# Patient Record
Sex: Female | Born: 1960 | Race: White | Hispanic: No | Smoking: Never smoker
Health system: Southern US, Community
[De-identification: ages and names within clinical notes are randomized; demographics above are authoritative.]

## PROBLEM LIST (undated history)

## (undated) DIAGNOSIS — F419 Anxiety disorder, unspecified: Secondary | ICD-10-CM

## (undated) DIAGNOSIS — M2550 Pain in unspecified joint: Secondary | ICD-10-CM

## (undated) DIAGNOSIS — M1990 Unspecified osteoarthritis, unspecified site: Secondary | ICD-10-CM

## (undated) DIAGNOSIS — E785 Hyperlipidemia, unspecified: Secondary | ICD-10-CM

## (undated) DIAGNOSIS — G589 Mononeuropathy, unspecified: Secondary | ICD-10-CM

## (undated) DIAGNOSIS — I10 Essential (primary) hypertension: Secondary | ICD-10-CM

## (undated) DIAGNOSIS — K219 Gastro-esophageal reflux disease without esophagitis: Secondary | ICD-10-CM

## (undated) DIAGNOSIS — N200 Calculus of kidney: Secondary | ICD-10-CM

## (undated) DIAGNOSIS — N393 Stress incontinence (female) (male): Secondary | ICD-10-CM

## (undated) DIAGNOSIS — K5790 Diverticulosis of intestine, part unspecified, without perforation or abscess without bleeding: Secondary | ICD-10-CM

## (undated) HISTORY — DX: Calculus of kidney: N20.0

## (undated) HISTORY — DX: Stress incontinence (female) (male): N39.3

## (undated) HISTORY — DX: Pain in unspecified joint: M25.50

## (undated) HISTORY — PX: KNEE SURGERY: SHX244

## (undated) HISTORY — PX: SHOULDER ARTHROSCOPY: SHX128

## (undated) HISTORY — DX: Hyperlipidemia, unspecified: E78.5

## (undated) HISTORY — DX: Unspecified osteoarthritis, unspecified site: M19.90

## (undated) HISTORY — PX: ABDOMINAL HYSTERECTOMY: SHX81

## (undated) HISTORY — DX: Mononeuropathy, unspecified: G58.9

## (undated) HISTORY — PX: ECTOPIC PREGNANCY SURGERY: SHX613

## (undated) HISTORY — DX: Anxiety disorder, unspecified: F41.9

## (undated) HISTORY — DX: Essential (primary) hypertension: I10

## (undated) HISTORY — DX: Diverticulosis of intestine, part unspecified, without perforation or abscess without bleeding: K57.90

## (undated) HISTORY — PX: TONSILLECTOMY AND ADENOIDECTOMY: SUR1326

## (undated) HISTORY — PX: CHOLECYSTECTOMY: SHX55

## (undated) HISTORY — PX: OTHER SURGICAL HISTORY: SHX169

## (undated) HISTORY — DX: Gastro-esophageal reflux disease without esophagitis: K21.9

---

## 1997-09-15 ENCOUNTER — Ambulatory Visit (HOSPITAL_COMMUNITY): Admission: RE | Admit: 1997-09-15 | Discharge: 1997-09-15 | Payer: Self-pay | Admitting: Internal Medicine

## 1998-09-20 ENCOUNTER — Other Ambulatory Visit: Admission: RE | Admit: 1998-09-20 | Discharge: 1998-09-20 | Payer: Self-pay | Admitting: Internal Medicine

## 1999-03-30 ENCOUNTER — Ambulatory Visit (HOSPITAL_COMMUNITY): Admission: RE | Admit: 1999-03-30 | Discharge: 1999-03-30 | Payer: Self-pay | Admitting: Internal Medicine

## 1999-03-30 ENCOUNTER — Encounter: Payer: Self-pay | Admitting: Internal Medicine

## 2002-01-21 ENCOUNTER — Encounter: Payer: Self-pay | Admitting: Internal Medicine

## 2002-01-21 ENCOUNTER — Ambulatory Visit (HOSPITAL_COMMUNITY): Admission: RE | Admit: 2002-01-21 | Discharge: 2002-01-21 | Payer: Self-pay | Admitting: Internal Medicine

## 2004-08-22 ENCOUNTER — Ambulatory Visit: Payer: Self-pay | Admitting: Gastroenterology

## 2004-08-24 ENCOUNTER — Ambulatory Visit: Payer: Self-pay | Admitting: Gastroenterology

## 2005-05-03 ENCOUNTER — Encounter: Payer: Self-pay | Admitting: Internal Medicine

## 2006-11-25 ENCOUNTER — Emergency Department: Payer: Self-pay | Admitting: Emergency Medicine

## 2009-05-03 ENCOUNTER — Ambulatory Visit: Admission: RE | Admit: 2009-05-03 | Discharge: 2009-05-03 | Payer: Self-pay | Admitting: General Surgery

## 2009-05-10 ENCOUNTER — Ambulatory Visit (HOSPITAL_COMMUNITY): Admission: RE | Admit: 2009-05-10 | Discharge: 2009-05-10 | Payer: Self-pay | Admitting: General Surgery

## 2009-05-26 ENCOUNTER — Encounter: Admission: RE | Admit: 2009-05-26 | Discharge: 2009-08-24 | Payer: Self-pay | Admitting: General Surgery

## 2009-08-03 ENCOUNTER — Ambulatory Visit (HOSPITAL_COMMUNITY): Admission: RE | Admit: 2009-08-03 | Discharge: 2009-08-04 | Payer: Self-pay | Admitting: General Surgery

## 2009-08-03 HISTORY — PX: LAPAROSCOPIC GASTRIC BANDING: SHX1100

## 2009-08-17 ENCOUNTER — Encounter
Admission: RE | Admit: 2009-08-17 | Discharge: 2009-09-30 | Payer: Self-pay | Source: Home / Self Care | Admitting: General Surgery

## 2009-09-28 ENCOUNTER — Encounter
Admission: RE | Admit: 2009-09-28 | Discharge: 2009-12-27 | Payer: Self-pay | Source: Home / Self Care | Attending: General Surgery | Admitting: General Surgery

## 2009-11-03 LAB — HM COLONOSCOPY

## 2010-01-11 ENCOUNTER — Encounter: Admit: 2010-01-11 | Payer: Self-pay | Admitting: General Surgery

## 2010-03-18 LAB — DIFFERENTIAL
Basophils Relative: 0 % (ref 0–1)
Eosinophils Absolute: 0 10*3/uL (ref 0.0–0.7)
Eosinophils Relative: 0 % (ref 0–5)
Monocytes Absolute: 0.7 10*3/uL (ref 0.1–1.0)
Monocytes Relative: 7 % (ref 3–12)
Neutrophils Relative %: 81 % — ABNORMAL HIGH (ref 43–77)

## 2010-03-18 LAB — CBC
HCT: 38 % (ref 36.0–46.0)
Hemoglobin: 12.9 g/dL (ref 12.0–15.0)
MCH: 32 pg (ref 26.0–34.0)
MCHC: 34 g/dL (ref 30.0–36.0)
MCV: 94.1 fL (ref 78.0–100.0)

## 2010-03-18 LAB — HEMOGLOBIN AND HEMATOCRIT, BLOOD: Hemoglobin: 13.6 g/dL (ref 12.0–15.0)

## 2010-03-19 LAB — DIFFERENTIAL
Eosinophils Relative: 0 % (ref 0–5)
Lymphocytes Relative: 18 % (ref 12–46)
Lymphs Abs: 1.7 10*3/uL (ref 0.7–4.0)
Monocytes Absolute: 0.5 10*3/uL (ref 0.1–1.0)
Neutro Abs: 7.6 10*3/uL (ref 1.7–7.7)

## 2010-03-19 LAB — COMPREHENSIVE METABOLIC PANEL
AST: 26 U/L (ref 0–37)
Albumin: 4 g/dL (ref 3.5–5.2)
Calcium: 9.6 mg/dL (ref 8.4–10.5)
Creatinine, Ser: 0.79 mg/dL (ref 0.4–1.2)
GFR calc Af Amer: >60 mL/min (ref >60)

## 2010-03-19 LAB — CBC
MCH: 31.8 pg (ref 26.0–34.0)
MCHC: 34.5 g/dL (ref 30.0–36.0)
Platelets: 274 10*3/uL (ref 150–400)
RDW: 12.7 % (ref 11.5–15.5)

## 2010-03-19 LAB — SURGICAL PCR SCREEN: Staphylococcus aureus: POSITIVE — AB

## 2010-07-29 ENCOUNTER — Encounter (INDEPENDENT_AMBULATORY_CARE_PROVIDER_SITE_OTHER): Payer: Self-pay

## 2010-07-29 ENCOUNTER — Ambulatory Visit (INDEPENDENT_AMBULATORY_CARE_PROVIDER_SITE_OTHER): Payer: BC Managed Care – PPO | Admitting: Physician Assistant

## 2010-07-29 NOTE — Patient Instructions (Signed)
Obtain upper GI and follow-up after your study.

## 2010-07-29 NOTE — Progress Notes (Signed)
  HISTORY: Tiffany Campos is a 50 y.o.female who received an AP-Standard lap-band in August 2011 by Dr. Daphine Deutscher. She comes in today with rather inconsistent symptoms of regurgitation and unpredictable intolerance of various foods. She denies persistent regurgitation but she does admit to reaching for slider foods when she feels a bit tight. She thinks a fill is in order today because her weight loss has slowed somewhat but she has had no increase in hunger or portion size.  VITAL SIGNS: Filed Vitals:   07/29/10 1549  BP: 106/70    PHYSICAL EXAM: Physical exam reveals a very well-appearing 50 y.o.female in no apparent distress Neurologic: Awake, alert, oriented Psych: Bright affect, conversant Respiratory: Breathing even and unlabored. No stridor or wheezing Extremities: Atraumatic, good range of motion. Skin: Warm, Dry, no rashes Musculoskeletal: Normal gait, Joints normal  ASSESMENT: 50 y.o.  female  s/p AP-Standard lap-band.   PLAN: I'm concerned about the inconsistencies with her food tolerances and as such I've ordered an upper GI. She'll return following her procedure where we'll make a decision on adjusting her band.

## 2010-08-03 ENCOUNTER — Ambulatory Visit
Admission: RE | Admit: 2010-08-03 | Discharge: 2010-08-03 | Disposition: A | Discharge status: Home / Self Care | Payer: BC Managed Care – PPO | Source: Ambulatory Visit | Attending: Physician Assistant | Admitting: Physician Assistant

## 2010-08-05 ENCOUNTER — Encounter (INDEPENDENT_AMBULATORY_CARE_PROVIDER_SITE_OTHER): Payer: Self-pay

## 2010-08-05 ENCOUNTER — Ambulatory Visit (INDEPENDENT_AMBULATORY_CARE_PROVIDER_SITE_OTHER): Payer: BC Managed Care – PPO | Admitting: Physician Assistant

## 2010-08-05 VITALS — BP 110/74 | Ht 65.0 in | Wt 187.0 lb

## 2010-08-05 DIAGNOSIS — Z4651 Encounter for fitting and adjustment of gastric lap band: Secondary | ICD-10-CM

## 2010-08-05 NOTE — Patient Instructions (Signed)
Return in 4 weeks or sooner if necessary

## 2010-08-05 NOTE — Progress Notes (Signed)
  HISTORY: Tiffany Campos is a 50 y.o.female who received an AP-Standard lap-band in August 2011 by Dr. Daphine Deutscher. She was seen last week for intermittent regurgitation and she was scheduled for Upper GI which she attended yesterday. She say she still has regurgitation. 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 She is over-restricted.  VITAL SIGNS: Filed Vitals:   08/05/10 1008  BP: 110/74    PHYSICAL EXAM: Physical exam reveals a very well-appearing 50 y.o.female in no apparent distress Neurologic: Awake, alert, oriented Psych: Bright affect, conversant Respiratory: Breathing even and unlabored. No stridor or wheezing Abdomen: Soft, nontender, nondistended to palpation. Incisions well-healed. No incisional hernias. Port easily palpated. Extremities: Atraumatic, good range of motion.  ASSESMENT: 50 y.o.  female  s/p AP-Standard lap-band. She is suffering from over-restriction.  PLAN: Her band was accessed and 0.5 mL fluid was removed. She was able to swallow water following this. We'll have her back in 4 weeks or sooner if symptoms do not improve.

## 2010-09-02 ENCOUNTER — Encounter (INDEPENDENT_AMBULATORY_CARE_PROVIDER_SITE_OTHER): Payer: BC Managed Care – PPO

## 2010-09-23 ENCOUNTER — Encounter (INDEPENDENT_AMBULATORY_CARE_PROVIDER_SITE_OTHER): Payer: Self-pay

## 2010-09-23 ENCOUNTER — Ambulatory Visit (INDEPENDENT_AMBULATORY_CARE_PROVIDER_SITE_OTHER): Payer: BC Managed Care – PPO | Admitting: Physician Assistant

## 2010-09-23 NOTE — Progress Notes (Signed)
  HISTORY: Tiffany Campos is a 50 y.o.female who received an AP-Standard lap-band in August 2011 by Dr. Daphine Deutscher. She comes in today feeling remarkably better since a modest removal of fluid. She has no further intolerance of solids and while she's continuing to eat small portions, she's able to get in adequate nutrition.  VITAL SIGNS: Filed Vitals:   09/23/10 1540  BP: 100/68  Pulse: 72  Temp: 97.8 F (36.6 C)  Resp: 16    PHYSICAL EXAM: Physical exam reveals a very well-appearing 50 y.o.female in no apparent distress Neurologic: Awake, alert, oriented Psych: Bright affect, conversant Respiratory: Breathing even and unlabored. No stridor or wheezing Extremities: Atraumatic, good range of motion. Skin: Warm, Dry, no rashes Musculoskeletal: Normal gait, Joints normal  ASSESMENT: 50 y.o.  female  s/p AP-Standard lap-band.   PLAN: We'll have her return in 4 weeks for re-evaluation or sooner if she finds a continued increase in weight.

## 2010-09-23 NOTE — Patient Instructions (Signed)
Return in one month or sooner if necessary

## 2010-10-21 ENCOUNTER — Ambulatory Visit (INDEPENDENT_AMBULATORY_CARE_PROVIDER_SITE_OTHER): Payer: BC Managed Care – PPO | Admitting: Physician Assistant

## 2010-10-21 ENCOUNTER — Encounter (INDEPENDENT_AMBULATORY_CARE_PROVIDER_SITE_OTHER): Payer: Self-pay | Admitting: Physician Assistant

## 2010-10-21 VITALS — BP 112/74 | Ht 65.0 in | Wt 191.2 lb

## 2010-10-21 DIAGNOSIS — Z9884 Bariatric surgery status: Secondary | ICD-10-CM

## 2010-10-21 NOTE — Progress Notes (Signed)
Tiffany Campos came in for an adjustment but felt the presence of an apple that she ate about 2 hours ago. I opted to wait a week to adjust her as I'm concerned that she'd become obstructed.

## 2010-10-28 ENCOUNTER — Encounter (INDEPENDENT_AMBULATORY_CARE_PROVIDER_SITE_OTHER): Payer: BC Managed Care – PPO

## 2010-11-04 ENCOUNTER — Encounter (INDEPENDENT_AMBULATORY_CARE_PROVIDER_SITE_OTHER): Payer: Self-pay

## 2010-11-04 ENCOUNTER — Ambulatory Visit (INDEPENDENT_AMBULATORY_CARE_PROVIDER_SITE_OTHER): Payer: BC Managed Care – PPO | Admitting: Physician Assistant

## 2010-11-04 VITALS — BP 124/78 | HR 60 | Temp 97.6°F | Resp 60 | Ht 66.5 in | Wt 193.4 lb

## 2010-11-04 DIAGNOSIS — Z4651 Encounter for fitting and adjustment of gastric lap band: Secondary | ICD-10-CM

## 2010-11-04 DIAGNOSIS — E669 Obesity, unspecified: Secondary | ICD-10-CM

## 2010-11-04 NOTE — Patient Instructions (Signed)
Take clear liquids for the next 48 hours. Thin protein shakes are ok to start on Saturday evening. Call us if you have persistent vomiting or regurgitation, night cough or reflux symptoms. Return as scheduled or sooner if you notice no changes in hunger/portion sizes.   

## 2010-11-04 NOTE — Progress Notes (Signed)
  HISTORY: Tiffany Campos is a 50 y.o.female who received an AP-Standard lap-band in August 2011 by Dr. Daphine Deutscher. She says her hunger and portion sizes have increased but has had no untoward symptoms. She relates some occasional nocturnal abdominal discomfort which self-resolves by morning. No fever. No nausea/vomiting. She also notices that her anxiety symptoms and use of PRN QHS Xanax has increased.  VITAL SIGNS: Filed Vitals:   11/04/10 1110  BP: 124/78  Pulse: 60  Temp: 97.6 F (36.4 C)  Resp: 60    PHYSICAL EXAM: Physical exam reveals a very well-appearing 50 y.o.female in no apparent distress Neurologic: Awake, alert, oriented Psych: Bright affect, conversant Respiratory: Breathing even and unlabored. No stridor or wheezing Abdomen: Soft, nontender, nondistended to palpation. Incisions well-healed. No incisional hernias. Port easily palpated. Extremities: Atraumatic, good range of motion.  ASSESMENT: 50 y.o.  female  s/p AP-Standard lap-band.   PLAN: The patient's port was accessed with a 20G Huber needle without difficulty. Clear fluid was aspirated and 0.2 mL saline was added to the port. The patient was able to swallow water without difficulty following the procedure and was instructed to take clear liquids for the next 24-48 hours and advance slowly as tolerated.

## 2010-11-18 ENCOUNTER — Encounter (INDEPENDENT_AMBULATORY_CARE_PROVIDER_SITE_OTHER): Payer: BC Managed Care – PPO

## 2011-02-02 ENCOUNTER — Encounter (INDEPENDENT_AMBULATORY_CARE_PROVIDER_SITE_OTHER): Payer: Self-pay | Admitting: General Surgery

## 2011-03-22 ENCOUNTER — Encounter (INDEPENDENT_AMBULATORY_CARE_PROVIDER_SITE_OTHER): Payer: Self-pay | Admitting: Surgery

## 2011-03-23 ENCOUNTER — Encounter (INDEPENDENT_AMBULATORY_CARE_PROVIDER_SITE_OTHER): Payer: Self-pay

## 2011-03-23 ENCOUNTER — Ambulatory Visit (INDEPENDENT_AMBULATORY_CARE_PROVIDER_SITE_OTHER): Payer: BC Managed Care – PPO | Admitting: Physician Assistant

## 2011-03-23 NOTE — Patient Instructions (Signed)
Return in 3 months or sooner if needed, especially if you have persistent increases in portion sizes or persistent hunger. Return also if you have persistent over-restriction.

## 2011-03-23 NOTE — Progress Notes (Signed)
  HISTORY: Tiffany Campos is a 51 y.o.female who received an AP-Standard lap-band in August 2011 by Dr. Daphine Deutscher. She comes in today with four pounds of weight loss since her last visit. She continues to have days where she can eat little solid food followed by days that she has no troubles whatsoever. She does not engage in an exercise program, however.  VITAL SIGNS: Filed Vitals:   03/23/11 0938  BP: 124/80  Pulse: 68  Temp: 98.1 F (36.7 C)  Resp: 18    PHYSICAL EXAM: Physical exam reveals a very well-appearing 51 y.o.female in no apparent distress Neurologic: Awake, alert, oriented Psych: Bright affect, conversant Respiratory: Breathing even and unlabored. No stridor or wheezing Extremities: Atraumatic, good range of motion. Skin: Warm, Dry, no rashes Musculoskeletal: Normal gait, Joints normal  ASSESMENT: 51 y.o.  female  s/p AP-Standard lap-band.   PLAN: We talked about increasing her physical activity as this is an obvious missing element in her weight loss program. I recommended walking 30 minutes three times a week then increasing the frequency steadily. She hinted toward wanting an adjustment today but I fear this would turn her intermittent solid food intolerance into a persistent one. She's doing great with her weight loss overall and I don't want that to change.

## 2011-05-25 ENCOUNTER — Encounter (INDEPENDENT_AMBULATORY_CARE_PROVIDER_SITE_OTHER): Payer: BC Managed Care – PPO

## 2011-05-25 HISTORY — PX: ROTATOR CUFF REPAIR: SHX139

## 2011-06-01 ENCOUNTER — Encounter (INDEPENDENT_AMBULATORY_CARE_PROVIDER_SITE_OTHER): Payer: Self-pay

## 2011-06-01 ENCOUNTER — Ambulatory Visit (INDEPENDENT_AMBULATORY_CARE_PROVIDER_SITE_OTHER): Payer: BC Managed Care – PPO | Admitting: Physician Assistant

## 2011-06-01 VITALS — BP 120/82 | HR 66 | Temp 99.2°F | Resp 18 | Ht 65.0 in | Wt 189.0 lb

## 2011-06-01 DIAGNOSIS — Z4651 Encounter for fitting and adjustment of gastric lap band: Secondary | ICD-10-CM

## 2011-06-01 NOTE — Progress Notes (Signed)
  HISTORY: Tiffany Campos is a 51 y.o.female who received an AP-Standard lap-band in August 2011 by Dr. Daphine Deutscher. She comes in having an increase in her hunger and portion sizes. She said she's gained about six pounds since her last visit but managed to lose it again since having R shoulder arthroscopy. She denies persistent regurgitation or reflux.  VITAL SIGNS: Filed Vitals:   06/01/11 1057  BP: 120/82  Pulse: 66  Temp: 99.2 F (37.3 C)  Resp: 18    PHYSICAL EXAM: Physical exam reveals a very well-appearing 51 y.o.female in no apparent distress Neurologic: Awake, alert, oriented Psych: Bright affect, conversant Respiratory: Breathing even and unlabored. No stridor or wheezing Abdomen: Soft, nontender, nondistended to palpation. Incisions well-healed. No incisional hernias. Port easily palpated. Extremities: Atraumatic, good range of motion.  ASSESMENT: 51 y.o.  female  s/p AP-Standard lap-band.   PLAN: The patient's port was accessed with a 20G Huber needle without difficulty. Clear fluid was aspirated and 0.25 mL saline was added to the port. The patient was able to swallow water without difficulty following the procedure and was instructed to take clear liquids for the next 24-48 hours and advance slowly as tolerated.

## 2011-06-01 NOTE — Patient Instructions (Signed)
Take clear liquids tonight. Thin protein shakes are ok to start tomorrow morning. Slowly advance your diet thereafter. Call us if you have persistent vomiting or regurgitation, night cough or reflux symptoms. Return as scheduled or sooner if you notice no changes in hunger/portion sizes.  

## 2011-06-22 ENCOUNTER — Ambulatory Visit (INDEPENDENT_AMBULATORY_CARE_PROVIDER_SITE_OTHER): Payer: BC Managed Care – PPO | Admitting: Physician Assistant

## 2011-06-22 ENCOUNTER — Encounter (INDEPENDENT_AMBULATORY_CARE_PROVIDER_SITE_OTHER): Payer: Self-pay

## 2011-06-22 VITALS — BP 124/80 | Ht 65.0 in | Wt 187.0 lb

## 2011-06-22 DIAGNOSIS — Z4651 Encounter for fitting and adjustment of gastric lap band: Secondary | ICD-10-CM

## 2011-06-22 NOTE — Progress Notes (Signed)
  HISTORY: Tiffany Campos is a 51 y.o.female who received an AP-Standard lap-band in August 2011 by Dr. Daphine Deutscher. She comes in today with complaints of solid food dysphagia since her last visit. She is able to get down crunchy foods and foods high in carbohydrate but has great difficulty with proteins.  VITAL SIGNS: Filed Vitals:   06/22/11 0838  BP: 124/80    PHYSICAL EXAM: Physical exam reveals a very well-appearing 51 y.o.female in no apparent distress Neurologic: Awake, alert, oriented Psych: Bright affect, conversant Respiratory: Breathing even and unlabored. No stridor or wheezing Abdomen: Soft, nontender, nondistended to palpation. Incisions well-healed. No incisional hernias. Port easily palpated. Extremities: Atraumatic, good range of motion.  ASSESMENT: 51 y.o.  female  s/p AP-Standard lap-band.   PLAN: The patient's port was accessed with a 20G Huber needle without difficulty. Clear fluid was aspirated and 0.25 mL saline was removed from the port. I recommended that she slowly advance her diet and to return to see [place] if she continues to have difficulty getting food down.

## 2011-06-22 NOTE — Patient Instructions (Signed)
Slowly advance diet. If you continue to have difficulty swallowing, call for a return visit.

## 2011-08-31 ENCOUNTER — Encounter (INDEPENDENT_AMBULATORY_CARE_PROVIDER_SITE_OTHER): Payer: BC Managed Care – PPO

## 2012-05-09 ENCOUNTER — Encounter (INDEPENDENT_AMBULATORY_CARE_PROVIDER_SITE_OTHER): Payer: BC Managed Care – PPO

## 2012-05-16 ENCOUNTER — Ambulatory Visit (INDEPENDENT_AMBULATORY_CARE_PROVIDER_SITE_OTHER): Payer: BC Managed Care – PPO | Admitting: Physician Assistant

## 2012-05-16 ENCOUNTER — Encounter (INDEPENDENT_AMBULATORY_CARE_PROVIDER_SITE_OTHER): Payer: Self-pay

## 2012-05-16 VITALS — BP 122/74 | HR 68 | Temp 98.0°F | Resp 18 | Ht 65.0 in | Wt 202.4 lb

## 2012-05-16 DIAGNOSIS — Z4651 Encounter for fitting and adjustment of gastric lap band: Secondary | ICD-10-CM

## 2012-05-16 NOTE — Patient Instructions (Signed)
Take clear liquids tonight. Thin protein shakes are ok to start tomorrow morning. Slowly advance your diet thereafter. Call us if you have persistent vomiting or regurgitation, night cough or reflux symptoms. Return as scheduled or sooner if you notice no changes in hunger/portion sizes.  

## 2012-05-16 NOTE — Progress Notes (Signed)
  HISTORY: Tiffany Campos is a 52 y.o.female who received an AP-Standard lap-band in August 2011 by Dr. Daphine Deutscher. She comes in with about 15 lbs weight gain since her last visit in June 2013. She has increased hunger and portion sizes but fortunately she has regurgitation only rarely. She would like a fill today to get the weight loss under better control.  VITAL SIGNS: Filed Vitals:   05/16/12 1041  BP: 122/74  Pulse: 68  Temp: 98 F (36.7 C)  Resp: 18    PHYSICAL EXAM: Physical exam reveals a very well-appearing 52 y.o.female in no apparent distress Neurologic: Awake, alert, oriented Psych: Bright affect, conversant Respiratory: Breathing even and unlabored. No stridor or wheezing Abdomen: Soft, nontender, nondistended to palpation. Incisions well-healed. No incisional hernias. Port easily palpated. Extremities: Atraumatic, good range of motion.  ASSESMENT: 52 y.o.  female  s/p AP-Standard lap-band.   PLAN: The patient's port was accessed with a 20G Huber needle without difficulty. Clear fluid was aspirated and 0.25 mL saline was added to the port. The patient was able to swallow water without difficulty following the procedure and was instructed to take clear liquids for the next 24-48 hours and advance slowly as tolerated.

## 2012-08-08 ENCOUNTER — Ambulatory Visit (INDEPENDENT_AMBULATORY_CARE_PROVIDER_SITE_OTHER): Payer: BC Managed Care – PPO | Admitting: Physician Assistant

## 2012-08-08 ENCOUNTER — Encounter (INDEPENDENT_AMBULATORY_CARE_PROVIDER_SITE_OTHER): Payer: Self-pay

## 2012-08-08 VITALS — BP 128/78 | HR 64 | Temp 98.4°F | Resp 16 | Ht 65.0 in | Wt 197.8 lb

## 2012-08-08 DIAGNOSIS — Z4651 Encounter for fitting and adjustment of gastric lap band: Secondary | ICD-10-CM

## 2012-08-08 NOTE — Progress Notes (Signed)
  HISTORY: Tiffany Campos is a 52 y.o.female who received an AP-Standard lap-band in August 2011 by Dr. Daphine Deutscher. She comes in with three months of obstructive symptoms. She began having these symptoms right after her last fill. She wanted to wait things out for the remaining months but she's having obstructive days for at least 5/7 days. She also complains of reflux for which she began taking nexium again with some relief.  VITAL SIGNS: Filed Vitals:   08/08/12 1039  BP: 128/78  Pulse: 64  Temp: 98.4 F (36.9 C)  Resp: 16    PHYSICAL EXAM: Physical exam reveals a very well-appearing 52 y.o.female in no apparent distress Neurologic: Awake, alert, oriented Psych: Bright affect, conversant Respiratory: Breathing even and unlabored. No stridor or wheezing Abdomen: Soft, nontender, nondistended to palpation. Incisions well-healed. No incisional hernias. Port easily palpated. Extremities: Atraumatic, good range of motion.  ASSESMENT: 52 y.o.  female  s/p AP-Standard lap-band.   PLAN: The patient's port was accessed with a 20G Huber needle without difficulty. Clear fluid was aspirated and 1 mL saline was removed from the port. The patient was advised to concentrate on healthy food choices and to avoid slider foods high in fats and carbohydrates. We'll have her back in one month to get back on track.

## 2012-08-08 NOTE — Patient Instructions (Addendum)
Return in September. Focus on good food choices as well as physical activity. Return sooner if you have an increase in hunger, portion sizes or weight. Return also for difficulty swallowing, night cough, reflux.

## 2012-08-15 ENCOUNTER — Encounter (INDEPENDENT_AMBULATORY_CARE_PROVIDER_SITE_OTHER): Payer: BC Managed Care – PPO

## 2012-09-19 ENCOUNTER — Ambulatory Visit (INDEPENDENT_AMBULATORY_CARE_PROVIDER_SITE_OTHER): Payer: BC Managed Care – PPO | Admitting: Physician Assistant

## 2012-09-19 ENCOUNTER — Encounter (INDEPENDENT_AMBULATORY_CARE_PROVIDER_SITE_OTHER): Payer: Self-pay

## 2012-09-19 VITALS — BP 130/90 | HR 72 | Resp 14 | Ht 65.5 in | Wt 200.2 lb

## 2012-09-19 DIAGNOSIS — Z4651 Encounter for fitting and adjustment of gastric lap band: Secondary | ICD-10-CM

## 2012-09-19 NOTE — Progress Notes (Signed)
  HISTORY: Ryley Teater is a 52 y.o.female who received an AP-Standard lap-band in August 2011 by Dr. Daphine Deutscher. She comes in today with about 3 pounds of weight gain since her last visit in August. She states that she had kept the weight off until about 3 days ago. She says surprisingly, despite removal of 1 mL, she has been able to keep her hunger and portion sizes under control without great effort. She does think a little bit of fluid would help her. She denies regurgitation or vomiting. She still has occasional reflux for which she takes Nexium.Marland Kitchen  VITAL SIGNS: Filed Vitals:   09/19/12 0947  BP: 130/90  Pulse: 72  Resp: 14    PHYSICAL EXAM: Physical exam reveals a very well-appearing 52 y.o.female in no apparent distress Neurologic: Awake, alert, oriented Psych: Bright affect, conversant Respiratory: Breathing even and unlabored. No stridor or wheezing Abdomen: Soft, nontender, nondistended to palpation. Incisions well-healed. No incisional hernias. Port easily palpated. Extremities: Atraumatic, good range of motion.  ASSESMENT: 52 y.o.  female  s/p AP-Standard lap-band.   PLAN: The patient's port was accessed with a 20G Huber needle without difficulty. Clear fluid was aspirated and 0.25 mL saline was added to the port. The patient was able to swallow water without difficulty following the procedure and was instructed to take clear liquids for the next 24-48 hours and advance slowly as tolerated.

## 2012-09-19 NOTE — Patient Instructions (Signed)

## 2012-10-31 ENCOUNTER — Encounter (INDEPENDENT_AMBULATORY_CARE_PROVIDER_SITE_OTHER): Payer: Self-pay

## 2012-10-31 ENCOUNTER — Ambulatory Visit (INDEPENDENT_AMBULATORY_CARE_PROVIDER_SITE_OTHER): Payer: BC Managed Care – PPO | Admitting: Physician Assistant

## 2012-10-31 VITALS — BP 118/80 | HR 68 | Resp 16 | Ht 65.0 in | Wt 204.8 lb

## 2012-10-31 DIAGNOSIS — Z4651 Encounter for fitting and adjustment of gastric lap band: Secondary | ICD-10-CM

## 2012-10-31 NOTE — Progress Notes (Signed)
  HISTORY: Tiffany Campos is a 52 y.o.female who received an AP-Standard lap-band in August 2011 by Dr. Daphine Deutscher. She comes in with 4.5 lbs weight gain in the past month. She attributes this to eating too much and eating foods that are easier to tolerate. She describes persistent hunger and larger portions. She would like a fill today.  VITAL SIGNS: Filed Vitals:   10/31/12 1109  BP: 118/80  Pulse: 68  Resp: 16    PHYSICAL EXAM: Physical exam reveals a very well-appearing 52 y.o.female in no apparent distress Neurologic: Awake, alert, oriented Psych: Bright affect, conversant Respiratory: Breathing even and unlabored. No stridor or wheezing Abdomen: Soft, nontender, nondistended to palpation. Incisions well-healed. No incisional hernias. Port easily palpated. Extremities: Atraumatic, good range of motion.  ASSESMENT: 52 y.o.  female  s/p AP-Standard lap-band.   PLAN: The patient's port was accessed with a 20G Huber needle without difficulty. Clear fluid was aspirated and 0.5 mL saline was added to the port. Initially she took some rather large gulps of water and had some burping but with smaller volumes she did well with no evidence of obstruction. She was instructed to take clear liquids for the next 24-48 hours and advance slowly as tolerated.

## 2012-10-31 NOTE — Patient Instructions (Signed)

## 2012-12-19 ENCOUNTER — Encounter (INDEPENDENT_AMBULATORY_CARE_PROVIDER_SITE_OTHER): Payer: BC Managed Care – PPO

## 2013-03-06 ENCOUNTER — Ambulatory Visit (INDEPENDENT_AMBULATORY_CARE_PROVIDER_SITE_OTHER): Payer: BC Managed Care – PPO | Admitting: Physician Assistant

## 2013-03-06 ENCOUNTER — Encounter (INDEPENDENT_AMBULATORY_CARE_PROVIDER_SITE_OTHER): Payer: Self-pay | Admitting: Physician Assistant

## 2013-03-06 VITALS — BP 118/82 | HR 76 | Temp 97.6°F | Resp 18 | Ht 65.0 in | Wt 206.5 lb

## 2013-03-06 DIAGNOSIS — Z9884 Bariatric surgery status: Secondary | ICD-10-CM | POA: Insufficient documentation

## 2013-03-06 DIAGNOSIS — R1310 Dysphagia, unspecified: Secondary | ICD-10-CM

## 2013-03-06 NOTE — Progress Notes (Signed)
  HISTORY: Tiffany Campos is a 53 y.o.female who received an AP-Standard lap-band in August 2011 by Dr. Daphine DeutscherMartin. She comes in with 2 lbs weight gain since her last visit in October. She describes repeated episodes of solid food dysphagia after 2-3 bites of food, which will persist for a week or more then seemingly resolves. She does not report any consistent troublesome foods when this occurs. She describes rather strong retching over time as well. She wants a fill because of her weight gain and the feeling sometimes that she can eat more than she should.  VITAL SIGNS: Filed Vitals:   03/06/13 1117  BP: 118/82  Pulse: 76  Temp: 97.6 F (36.4 C)  Resp: 18    PHYSICAL EXAM: Physical exam reveals a very well-appearing 53 y.o.female in no apparent distress Neurologic: Awake, alert, oriented Psych: Bright affect, conversant Respiratory: Breathing even and unlabored. No stridor or wheezing Extremities: Atraumatic, good range of motion. Skin: Warm, Dry, no rashes Musculoskeletal: Normal gait, Joints normal  ASSESMENT: 53 y.o.  female  s/p AP-Standard lap-band.   PLAN: I'm concerned about slip/prolapse. It's very difficult to get a consistent picture of what's happening. She is avoiding dry sources of protein and is using sauces to get foods down. I fear that she may actually be too tight. We'll have her back after the study.

## 2013-03-06 NOTE — Patient Instructions (Signed)
Obtain your upper GI study. Return to clinic afterward.

## 2013-03-12 ENCOUNTER — Ambulatory Visit
Admission: RE | Admit: 2013-03-12 | Discharge: 2013-03-12 | Disposition: A | Discharge status: Home / Self Care | Payer: BC Managed Care – PPO | Source: Ambulatory Visit | Attending: Physician Assistant | Admitting: Physician Assistant

## 2013-03-12 DIAGNOSIS — R1310 Dysphagia, unspecified: Secondary | ICD-10-CM

## 2013-03-12 DIAGNOSIS — Z9884 Bariatric surgery status: Secondary | ICD-10-CM

## 2013-03-13 ENCOUNTER — Encounter (INDEPENDENT_AMBULATORY_CARE_PROVIDER_SITE_OTHER): Payer: Self-pay | Admitting: Surgery

## 2013-03-13 ENCOUNTER — Encounter (INDEPENDENT_AMBULATORY_CARE_PROVIDER_SITE_OTHER): Payer: BC Managed Care – PPO

## 2013-03-13 ENCOUNTER — Ambulatory Visit (INDEPENDENT_AMBULATORY_CARE_PROVIDER_SITE_OTHER): Payer: BC Managed Care – PPO | Admitting: Surgery

## 2013-03-13 VITALS — BP 112/84 | HR 68 | Temp 98.3°F | Resp 16 | Ht 65.0 in | Wt 204.8 lb

## 2013-03-13 DIAGNOSIS — Z9884 Bariatric surgery status: Secondary | ICD-10-CM | POA: Insufficient documentation

## 2013-03-13 NOTE — Patient Instructions (Signed)
Work on portion control and what you eat.  Good Carbs:  Brocccoli, asparagus, spinach, mustard greens,    Intermediate:  Beans  Bad Carbs: Potatoes (white, sweet, or [place] Fries), carrots, squash,

## 2013-03-13 NOTE — Progress Notes (Signed)
Lapband Fill Encounter Problem List:   Patient Active Problem List   Diagnosis Date Noted  . [facility] 2011 03/13/2013    Tiffany Campos Body mass index is 34.08 kg/(m^2). Weight loss since surgery  37  Having regurgitation?:  yes  Feel that they need a fill?    Nocturnal reflux?  Not necessarily  Amount of fill  -5     Instructions given and weight loss goals discussed.    I think it Tiffany Campos has been overfilled and has developed maladaptive eating. I reviewed her upper GI and it may be that we will need to ultimately endoscope her period at the present time in her band medication is in order and we will then build from that.  Return 2 weeks  Matt B. Daphine DeutscherMartin, MD, FACS

## 2013-03-27 ENCOUNTER — Encounter (INDEPENDENT_AMBULATORY_CARE_PROVIDER_SITE_OTHER): Payer: Self-pay | Admitting: Surgery

## 2013-03-27 ENCOUNTER — Ambulatory Visit (INDEPENDENT_AMBULATORY_CARE_PROVIDER_SITE_OTHER): Payer: BC Managed Care – PPO | Admitting: Surgery

## 2013-03-27 VITALS — BP 122/80 | HR 64 | Resp 14 | Ht 65.5 in | Wt 212.0 lb

## 2013-03-27 DIAGNOSIS — Z9884 Bariatric surgery status: Secondary | ICD-10-CM

## 2013-03-27 NOTE — Progress Notes (Signed)
Lapband Fill Encounter Problem List:   Patient Active Problem List   Diagnosis Date Noted  . [facility] 03/[facility]/2013    Tiffany Campos Body mass index is 34.73 kg/(m^2). Weight loss since surgery  29.6  Having regurgitation?:  no  Feel that they need a fill?  yes  Nocturnal reflux?  no  Amount of fill  2.5     Instructions given and weight loss goals discussed.    5 cc had been removed before and patient had a band vacation.  Now time to try to get back to the green zone.  3 added initially and this pushed back to 2.5 cc.   Return 2 weeks.   Matt B. Daphine DeutscherMartin, MD, FACS

## 2013-03-27 NOTE — Patient Instructions (Signed)
Good Carbs:  Brocccoli, asparagus, spinach, mustard greens,    Intermediate:  Beans  Bad Carbs: Potatoes (white, sweet, or [place] Fries), carrots, squash, corn or corn products (chips), rice, pasta

## 2013-04-03 ENCOUNTER — Ambulatory Visit (INDEPENDENT_AMBULATORY_CARE_PROVIDER_SITE_OTHER): Payer: BC Managed Care – PPO | Admitting: Surgery

## 2013-04-03 ENCOUNTER — Encounter (INDEPENDENT_AMBULATORY_CARE_PROVIDER_SITE_OTHER): Payer: Self-pay | Admitting: Surgery

## 2013-04-03 VITALS — BP 122/86 | HR 80 | Temp 97.8°F | Resp 16 | Ht 65.5 in | Wt 214.0 lb

## 2013-04-03 DIAGNOSIS — Z9884 Bariatric surgery status: Secondary | ICD-10-CM

## 2013-04-03 NOTE — Progress Notes (Signed)
Lapband Fill Encounter Problem List:   Patient Active Problem List   Diagnosis Date Noted  . [facility] 03/13/2013    Jenny Feigel Body mass index is 35.06 kg/(m^2). Weight loss since surgery  27.6  Having regurgitation?:  no  Feel that they need a fill?  yes  Nocturnal reflux?  no  Amount of fill  1.5     Instructions given and weight loss goals discussed.    She has been feeling totally unrestricted.  Added 1.5 cc to band.  Will see her back in 3 weeks-continue to monitor for prolapse or recurrent hiatus hernia  Matt B. Daphine DeutscherMartin, MD, FACS

## 2013-04-23 ENCOUNTER — Ambulatory Visit (INDEPENDENT_AMBULATORY_CARE_PROVIDER_SITE_OTHER): Payer: BC Managed Care – PPO | Admitting: Surgery

## 2013-04-23 ENCOUNTER — Encounter (INDEPENDENT_AMBULATORY_CARE_PROVIDER_SITE_OTHER): Payer: Self-pay | Admitting: Surgery

## 2013-04-23 VITALS — BP 126/78 | HR 77 | Temp 97.4°F | Ht 65.0 in | Wt 213.4 lb

## 2013-04-23 DIAGNOSIS — Z4651 Encounter for fitting and adjustment of gastric lap band: Secondary | ICD-10-CM

## 2013-04-23 NOTE — Progress Notes (Signed)
Lapband Fill Encounter Problem List:   Patient Active Problem List   Diagnosis Date Noted  . [facility] 03/[facility]2013    Rikayla Tamburri Body mass index is 35.51 kg/(m^2). Weight loss since surgery  28.2  Having regurgitation?:  no  Feel that they need a fill?  yes  Nocturnal reflux?  no  Amount of fill  0.5     Instructions given and weight loss goals discussed.    Discussed what she is doing right and slips that she has--drinking wine in the evening.    Matt B. Daphine DeutscherMartin, MD, FACS

## 2013-04-23 NOTE — Patient Instructions (Signed)

## 2013-05-23 ENCOUNTER — Encounter (INDEPENDENT_AMBULATORY_CARE_PROVIDER_SITE_OTHER): Payer: BC Managed Care – PPO | Admitting: Surgery

## 2013-08-06 LAB — HM MAMMOGRAPHY: HM Mammogram: NORMAL

## 2013-11-04 ENCOUNTER — Ambulatory Visit (INDEPENDENT_AMBULATORY_CARE_PROVIDER_SITE_OTHER): Payer: BC Managed Care – PPO | Admitting: Family Medicine

## 2013-11-04 ENCOUNTER — Encounter: Payer: Self-pay | Admitting: Family Medicine

## 2013-11-04 VITALS — BP 110/78 | HR 69 | Temp 97.8°F | Ht 64.5 in | Wt 201.0 lb

## 2013-11-04 DIAGNOSIS — I10 Essential (primary) hypertension: Secondary | ICD-10-CM

## 2013-11-04 DIAGNOSIS — K219 Gastro-esophageal reflux disease without esophagitis: Secondary | ICD-10-CM

## 2013-11-04 DIAGNOSIS — F411 Generalized anxiety disorder: Secondary | ICD-10-CM

## 2013-11-04 DIAGNOSIS — E669 Obesity, unspecified: Secondary | ICD-10-CM

## 2013-11-04 DIAGNOSIS — M158 Other polyosteoarthritis: Secondary | ICD-10-CM

## 2013-11-04 MED ORDER — PANTOPRAZOLE SODIUM 40 MG PO TBEC: 40.0000 mg | DELAYED_RELEASE_TABLET | Freq: Every day | ORAL | Status: DC

## 2013-11-04 MED ORDER — ETODOLAC 400 MG PO TABS: 400.0000 mg | ORAL_TABLET | Freq: Two times a day (BID) | ORAL | Status: DC

## 2013-11-04 MED ORDER — HYDROCHLOROTHIAZIDE 25 MG PO TABS: 25.0000 mg | ORAL_TABLET | Freq: Every day | ORAL | Status: DC

## 2013-11-04 MED ORDER — ALPRAZOLAM 1 MG PO TABS: 1.0000 mg | ORAL_TABLET | Freq: Two times a day (BID) | ORAL | Status: DC | PRN

## 2013-11-04 NOTE — Progress Notes (Signed)
Pre visit review using our clinic review tool, if applicable. No additional management support is needed unless otherwise documented below in the visit note.  New patient.   Hypertension:    Using medication without problems or lightheadedness: yes Chest pain with exertion:no Edema:no Short of breath:no  OA prev per ortho with lodine use, concurrent PPI use.  H/o lap band noted, has been able to tolerate meds.  Requesting records.    Obesity s/p lab band noted.  Surgery noted reviewed by EMR.  D/w pt about cutting back on wine qhs.   Anxiety.  Chronic xanax use.  No ADE on meds. Requesting records from prev PCP.   PMH and SH reviewed  ROS: See HPI, otherwise noncontributory.  Meds, vitals, and allergies reviewed.   GEN: nad, alert and oriented HEENT: mucous membranes moist NECK: supple w/o LA CV: rrr.  PULM: ctab, no inc wob ABD: soft, +bs EXT: no edema SKIN: no acute rash

## 2013-11-04 NOTE — Patient Instructions (Signed)
We'll request your records.  Take care.  Glad to see you.

## 2013-11-06 ENCOUNTER — Other Ambulatory Visit: Payer: Self-pay | Admitting: Family Medicine

## 2013-11-06 ENCOUNTER — Encounter: Payer: Self-pay | Admitting: Family Medicine

## 2013-11-06 DIAGNOSIS — E669 Obesity, unspecified: Secondary | ICD-10-CM | POA: Insufficient documentation

## 2013-11-06 DIAGNOSIS — M1990 Unspecified osteoarthritis, unspecified site: Secondary | ICD-10-CM | POA: Insufficient documentation

## 2013-11-06 DIAGNOSIS — K219 Gastro-esophageal reflux disease without esophagitis: Secondary | ICD-10-CM | POA: Insufficient documentation

## 2013-11-06 DIAGNOSIS — F411 Generalized anxiety disorder: Secondary | ICD-10-CM | POA: Insufficient documentation

## 2013-11-06 DIAGNOSIS — I10 Essential (primary) hypertension: Secondary | ICD-10-CM | POA: Insufficient documentation

## 2013-11-06 NOTE — Assessment & Plan Note (Signed)
Controlled, continue as is.  Will review recent labs and will scan into chart.  No change in meds now.

## 2013-11-06 NOTE — Assessment & Plan Note (Addendum)
D/w pt about weight loss with diet and exercise, cutting empty calories. >30 minutes spent in face to face time with patient, >50% spent in counselling or coordination of care.

## 2013-11-06 NOTE — Assessment & Plan Note (Signed)
Okay to continue nsaid for now, requesting ortho records.

## 2013-11-06 NOTE — Assessment & Plan Note (Signed)
Has been on xanax chronically, we'll address this when I can review her records.  Okay to continue as is for now.

## 2013-11-06 NOTE — Assessment & Plan Note (Signed)
Continue PPI given nsaid use and lap band hx.

## 2013-11-09 ENCOUNTER — Other Ambulatory Visit: Payer: Self-pay | Admitting: Family Medicine

## 2013-11-10 ENCOUNTER — Telehealth: Payer: Self-pay | Admitting: Family Medicine

## 2013-11-10 MED ORDER — ALPRAZOLAM 1 MG PO TABS: 1.0000 mg | ORAL_TABLET | Freq: Two times a day (BID) | ORAL | Status: DC | PRN

## 2013-11-10 MED ORDER — PANTOPRAZOLE SODIUM 40 MG PO TBEC: 40.0000 mg | DELAYED_RELEASE_TABLET | Freq: Every day | ORAL | Status: DC

## 2013-11-10 MED ORDER — ETODOLAC 400 MG PO TABS: 400.0000 mg | ORAL_TABLET | Freq: Two times a day (BID) | ORAL | Status: DC

## 2013-11-10 MED ORDER — HYDROCHLOROTHIAZIDE 25 MG PO TABS: 25.0000 mg | ORAL_TABLET | Freq: Every day | ORAL | Status: DC

## 2013-11-10 NOTE — Telephone Encounter (Signed)
I cancelled the order, then added it again as a "no print" just to have the med list correct.  Thanks.

## 2013-11-10 NOTE — Telephone Encounter (Signed)
After Tiffany KocherRegina was told that the patient had just picked up a 30 day prescription, she did not give the prescription to the pharmacist.  Prescription will need to be canceled in EPIC.

## 2013-11-10 NOTE — Telephone Encounter (Signed)
Tried to call in Xanax to pharmacy and was advised by patient that patient just got a 30 day of medication on 10/27/13. Pharmacist stated that the refill is way too soon.

## 2013-11-10 NOTE — Telephone Encounter (Signed)
Thanks. Will you ask them to put it on hold?  The new rx will be coming through this clinic.   If they can't, then please cancel it out. Thanks.

## 2013-11-10 NOTE — Telephone Encounter (Signed)
Notify pt.  I sent the lodine, protonix and HCTZ.  Please call in the xanax.   Have the tramadol come through the other clinic for now.   Thanks.

## 2013-11-11 ENCOUNTER — Other Ambulatory Visit: Payer: Self-pay | Admitting: Family Medicine

## 2013-12-02 ENCOUNTER — Other Ambulatory Visit: Payer: Self-pay | Admitting: Family Medicine

## 2013-12-02 ENCOUNTER — Encounter: Payer: Self-pay | Admitting: Family Medicine

## 2013-12-02 LAB — HEMOGLOBIN
ALT: 28 U/L (ref 7–35)
ANA: NEGATIVE
AST: 28 U/L
Creatinine, Ser: 0.77
GLUCOSE: 82
Hemoglobin: 15.7 g/dL — AB (ref 11.8–15.5)

## 2013-12-02 NOTE — Telephone Encounter (Signed)
Electronic refill request. Started:  11/10/2013.  Please advise.

## 2013-12-03 NOTE — Telephone Encounter (Signed)
The last rx was early, please make sure with pharmacy that this is due.  If so, then call in.  Thanks.

## 2013-12-03 NOTE — Telephone Encounter (Signed)
Spoke to pharmacist and was advised that this is due. Rx called in as instructed.

## 2013-12-05 ENCOUNTER — Ambulatory Visit (INDEPENDENT_AMBULATORY_CARE_PROVIDER_SITE_OTHER): Payer: BC Managed Care – PPO | Admitting: Internal Medicine

## 2013-12-05 ENCOUNTER — Telehealth: Payer: Self-pay

## 2013-12-05 ENCOUNTER — Encounter: Payer: Self-pay | Admitting: Internal Medicine

## 2013-12-05 VITALS — BP 126/80 | HR 74 | Temp 98.3°F | Wt 200.5 lb

## 2013-12-05 DIAGNOSIS — J069 Acute upper respiratory infection, unspecified: Secondary | ICD-10-CM

## 2013-12-05 MED ORDER — HYDROCODONE-HOMATROPINE 5-1.5 MG/5ML PO SYRP: 5.0000 mL | ORAL_SOLUTION | Freq: Three times a day (TID) | ORAL | Status: DC | PRN

## 2013-12-05 MED ORDER — AZITHROMYCIN 250 MG PO TABS: ORAL_TABLET | ORAL | Status: DC

## 2013-12-05 NOTE — Progress Notes (Signed)
HPI  Pt presents to the clinic today with c/o cough. She reports this started about 4 days ago with a sore throat. The next day she developed hoarseness and diarrhea. Subsequently she developed a productive cough with brown mucous and headache. She denies fever, chills or body aches. She has tried Mucinex and Robitussin OTC with some relief. She has no history of allergies or breathing problems. She reports that she is UTD on her flu and pneumonia shot. She does have a history of multiple pneumonias. She has had sick contacts.  Review of Systems      Past Medical History  Diagnosis Date  . Hypertension   . GERD (gastroesophageal reflux disease)   . Joint pain   . Hyperlipidemia   . Arthritis   . Anxiety   . Diverticulosis   . Kidney stones   . Stress incontinence     Family History  Problem Relation Age of Onset  . Peripheral vascular disease Father   . GER disease Father   . Diabetes Mother   . Hypertension Mother   . Arthritis Mother   . GER disease Mother   . Cancer Mother     carcinoid tumor    History   Social History  . Marital Status: Married    Spouse Name: N/A    Number of Children: N/A  . Years of Education: N/A   Occupational History  . Not on file.   Social History Main Topics  . Smoking status: Never Smoker   . Smokeless tobacco: Never Used  . Alcohol Use: Yes     Comment: 2 - 3 glasses of wine per day  . Drug Use: No  . Sexual Activity: Not on file   Other Topics Concern  . 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  HEENT:  Positive sore throat. Denies eye redness, eye pain, pressure behind the eyes, facial pain, nasal congestion, ear pain, ringing in the ears, wax buildup, runny nose or bloody nose. Respiratory: Positive cough. Denies difficulty breathing or shortness of breath.  Cardiovascular: Denies  chest pain, chest tightness, palpitations or swelling in the hands or feet.   No other specific complaints in a complete review of systems (except as listed in HPI above).  Objective:   BP 126/80 mmHg  Pulse 74  Temp(Src) 98.3 F (36.8 C) (Oral)  Wt 200 lb 8 oz (90.946 kg)  SpO2 94% Wt Readings from Last 3 Encounters:  12/05/13 200 lb 8 oz (90.946 kg)  11/04/13 201 lb (91.173 kg)  04/23/13 213 lb 6.4 oz (96.798 kg)     General: Appears her stated age, well developed, well nourished in NAD. HEENT: Head: normal shape and size, no sinus tenderness noted;  Ears: Tm's gray and intact, normal light reflex; Nose: mucosa pink and moist, septum midline; Throat/Mouth: Teeth present, mucosa erythematous and moist, no exudate noted, no lesions or ulcerations noted. No cervical adenopathy noted. Cardiovascular: Normal rate and rhythm. S1,S2 noted.  No murmur, rubs or gallops noted.  Pulmonary/Chest: Normal effort and positive vesicular breath sounds. No respiratory distress. No wheezes, rales or ronchi noted.      Assessment & Plan:   Upper Respiratory Infection with Cough:  Get some rest and drink plenty of water Do salt water gargles for the sore throat eRx for Azithromax x 5 days Rx for Hycodan  cough syrup  RTC as needed or if symptoms persist.

## 2013-12-05 NOTE — Telephone Encounter (Signed)
Pt has S/T, congestion and prod cough with no fever; pt requesting abx. Pt scheduled appt today at 11:30 with Dr [place]

## 2013-12-05 NOTE — Progress Notes (Signed)
Pre visit review using our clinic review tool, if applicable. No additional management support is needed unless otherwise documented below in the visit note. 

## 2013-12-05 NOTE — Telephone Encounter (Signed)
Saw Tiffany Campos

## 2013-12-05 NOTE — Telephone Encounter (Signed)
PLEASE NOTE: All timestamps contained within this report are represented as [place] Standard Time. CONFIDENTIALTY NOTICE: This fax transmission is intended only for the addressee. It contains information that is legally privileged, confidential or otherwise protected from use or disclosure. If you are not the intended recipient, you are strictly prohibited from reviewing, disclosing, copying using or disseminating any of this information or taking any action in reliance on or regarding this information. If you have received this fax in error, please notify us immediately by telephone so that we can arrange for its return to us. Phone: [phone], Toll-Free: [phone], Fax: [phone] Page: 1 of 2 Call Id: [id number] [facility] Client TELEPHONE ADVICE RECORD [facility] Patient Name: Tiffany Campos Gender: Female DOB: 04/18/1960 Age: 53 Y 9 M 1 D Return Phone Number: [phone] (Primary) Address: City/State/Zip: [place] Client [facility] Client Client Site [facility] [place] - Day Physician Raechel Acheuncan, Shaw Contact Type Call Call Type Triage / Clinical Relationship To Patient Self Return Phone Number [phone] (Primary) Chief Complaint Cough Initial Comment Caller states she is losing her voice, sore throat. Dry hacking cough. Diarrhea PreDisposition Home Care Nurse Assessment Nurse: Charna Elizabethrumbull, RN, Lynden Angathy Date/Time Lamount Cohen(Eastern Time): 12/05/2013 9:48:06 AM Confirm and document reason for call. If symptomatic, describe symptoms. ---Caller states she developed a sore throat 3 days ago that has improved, a cough 2 days ago and diarrhea 2 days ago. No fever. No severe breathing and swallowing difficulty. Has the patient traveled out of the country within the last 30 days? ---No Does the patient require triage? ---Yes Related visit to physician within the last 2 weeks? ---Yes Does the PT have any  chronic conditions? (i.e. diabetes, asthma, etc.) ---Yes List chronic conditions. ---Osteoarthritis Did the patient indicate they were pregnant? ---No Guidelines Guideline Title Affirmed Question Affirmed Notes Nurse Date/Time (Eastern Time) Cough - Acute Productive SEVERE coughing spells (e.g., whooping sound after coughing, vomiting after coughing) Trumbull, RN, Cathy 12/05/2013 9:51:26 AM Diarrhea Mild diarrhea (all triage questions negative) Trumbull, RN, [facility] 12/05/2013 9:57:30 AM Disp. Time Lamount Cohen(Eastern Time) Disposition Final User 12/05/2013 9:56:56 AM See Physician within [address] Trumbull, RN, Lynden AngCathy 12/05/2013 9:59:25 AM Home Care Yes Trumbull, RN, Cathy PLEASE NOTE: All timestamps contained within this report are represented as [place] Standard Time. CONFIDENTIALTY NOTICE: This fax transmission is intended only for the addressee. It contains information that is legally privileged, confidential or otherwise protected from use or disclosure. If you are not the intended recipient, you are strictly prohibited from reviewing, disclosing, copying using or disseminating any of this information or taking any action in reliance on or regarding this information. If you have received this fax in error, please notify us immediately by telephone so that we can arrange for its return to us. Phone: [phone], Toll-Free: [phone], Fax: [phone] Page: 2 of 2 Call Id: [id number] Caller Understands: Yes Disagree/Comply: Phil Doppomply Caller Understands: Yes Disagree/Comply: Comply Care Advice Given Per Guideline SEE PHYSICIAN WITHIN 24 HOURS: * IF OFFICE WILL BE OPEN: You need to be examined within the next 24 hours. Call your doctor when the office opens, and make an appointment. COUGHING SPELLS: * Drink warm fluids. Inhale warm mist. (Reason: both relax the airway and loosen up the phlegm) * Suck on cough drops or hard candy to coat the irritated throat. HUMIDIFIER: If the air is dry, use a  humidifier in the bedroom. (Reason: dry air makes coughs worse) AVOID TOBACCO SMOKE: Smoking  or being exposed to smoke makes coughs much worse. VOMITING WITH COUGHING SPELLS: Eat smaller amounts with meal and snack to reduce the chances of repeated vomiting. (Reason: vomiting is more likely with a full stomach) CALL BACK IF: * Difficulty breathing occurs * You become worse. CARE ADVICE given per Cough - Acute Productive (Adult) guideline. HOME CARE: You should be able to treat this at home. REASSURANCE: * In healthy adults, most new onset diarrhea is caused by a viral infection of the intestines. * Diarrhea is the body's way of getting rid of the germs. FLUID THERAPY DURING MILDMODERATE DIARRHEA: * Drink more fluids, at least 8-10 glasses (8 oz) daily. * Supplement this with saltine crackers or soups, to make certain that you are getting sufficient fluid and salt to meet your body's needs. * Avoid caffeinated beverages (Reason: caffeine is mildly dehydrating). CONTAGIOUSNESS: * Be certain to wash your hands after using the restroom. * If your work is cooking, Aeronautical engineerhandling, serving or preparing food, then you should not work until the diarrhea has completely stopped. CALL BACK IF: * Signs of dehydration occur (e.g., no urine over 12 hours, very dry mouth, lightheaded, etc.) * Diarrhea persists over 7 days * You become worse. CARE ADVICE given per Diarrhea (Adult) guideline. Comments User: Colonel Baldathy, Trumbull, RN Date/Time Lamount Cohen(Eastern Time): 12/05/2013 10:03:22 AM Transferred to Joice LoftsAmber to check on appointment options. Referrals REFERRED TO PCP OFFICE

## 2013-12-05 NOTE — Patient Instructions (Signed)
Upper Respiratory Infection, Adult An upper respiratory infection (URI) is also sometimes known as the common cold. The upper respiratory tract includes the nose, sinuses, throat, trachea, and bronchi. Bronchi are the airways leading to the lungs. Most people improve within 1 week, but symptoms can last up to 2 weeks. A residual cough may last even longer.  CAUSES Many different viruses can infect the tissues lining the upper respiratory tract. The tissues become irritated and inflamed and often become very moist. Mucus production is also common. A cold is contagious. You can easily spread the virus to others by oral contact. This includes kissing, sharing a glass, coughing, or sneezing. Touching your mouth or nose and then touching a surface, which is then touched by another person, can also spread the virus. SYMPTOMS  Symptoms typically develop 1 to 3 days after you come in contact with a cold virus. Symptoms vary from person to person. They may include:  Runny nose.  Sneezing.  Nasal congestion.  Sinus irritation.  Sore throat.  Loss of voice (laryngitis).  Cough.  Fatigue.  Muscle aches.  Loss of appetite.  Headache.  Low-grade fever. DIAGNOSIS  You might diagnose your own cold based on familiar symptoms, since most people get a cold 2 to 3 times a year. Your caregiver can confirm this based on your exam. Most importantly, your caregiver can check that your symptoms are not due to another disease such as strep throat, sinusitis, pneumonia, asthma, or epiglottitis. Blood tests, throat tests, and X-rays are not necessary to diagnose a common cold, but they may sometimes be helpful in excluding other more serious diseases. Your caregiver will decide if any further tests are required. RISKS AND COMPLICATIONS  You may be at risk for a more severe case of the common cold if you smoke cigarettes, have chronic heart disease (such as heart failure) or lung disease (such as asthma), or if  you have a weakened immune system. The very young and very old are also at risk for more serious infections. Bacterial sinusitis, middle ear infections, and bacterial pneumonia can complicate the common cold. The common cold can worsen asthma and chronic obstructive pulmonary disease (COPD). Sometimes, these complications can require emergency medical care and may be life-threatening. PREVENTION  The best way to protect against getting a cold is to practice good hygiene. Avoid oral or hand contact with people with cold symptoms. Wash your hands often if contact occurs. There is no clear evidence that vitamin C, vitamin E, echinacea, or exercise reduces the chance of developing a cold. However, it is always recommended to get plenty of rest and practice good nutrition. TREATMENT  Treatment is directed at relieving symptoms. There is no cure. Antibiotics are not effective, because the infection is caused by a virus, not by bacteria. Treatment may include:  Increased fluid intake. Sports drinks offer valuable electrolytes, sugars, and fluids.  Breathing heated mist or steam (vaporizer or shower).  Eating chicken soup or other clear broths, and maintaining good nutrition.  Getting plenty of rest.  Using gargles or lozenges for comfort.  Controlling fevers with ibuprofen or acetaminophen as directed by your caregiver.  Increasing usage of your inhaler if you have asthma. Zinc gel and zinc lozenges, taken in the first 24 hours of the common cold, can shorten the duration and lessen the severity of symptoms. Pain medicines may help with fever, muscle aches, and throat pain. A variety of non-prescription medicines are available to treat congestion and runny nose. Your caregiver   can make recommendations and may suggest nasal or lung inhalers for other symptoms.  HOME CARE INSTRUCTIONS   Only take over-the-counter or prescription medicines for pain, discomfort, or fever as directed by your  caregiver.  Use a warm mist humidifier or inhale steam from a shower to increase air moisture. This may keep secretions moist and make it easier to breathe.  Drink enough water and fluids to keep your urine clear or pale yellow.  Rest as needed.  Return to work when your temperature has returned to normal or as your caregiver advises. You may need to stay home longer to avoid infecting others. You can also use a face mask and careful hand washing to prevent spread of the virus. SEEK MEDICAL CARE IF:   After the first few days, you feel you are getting worse rather than better.  You need your caregiver's advice about medicines to control symptoms.  You develop chills, worsening shortness of breath, or brown or red sputum. These may be signs of pneumonia.  You develop yellow or brown nasal discharge or pain in the face, especially when you bend forward. These may be signs of sinusitis.  You develop a fever, swollen neck glands, pain with swallowing, or white areas in the back of your throat. These may be signs of strep throat. SEEK IMMEDIATE MEDICAL CARE IF:   You have a fever.  You develop severe or persistent headache, ear pain, sinus pain, or chest pain.  You develop wheezing, a prolonged cough, cough up blood, or have a change in your usual mucus (if you have chronic lung disease).  You develop sore muscles or a stiff neck. Document Released: 06/14/2000 Document Revised: 03/13/2011 Document Reviewed: 03/26/2013 ExitCare Patient Information 2015 [employer]. This information is not intended to replace advice given to you by your health care provider. Make sure you discuss any questions you have with your health care provider.  

## 2013-12-16 ENCOUNTER — Telehealth: Payer: Self-pay

## 2013-12-16 ENCOUNTER — Other Ambulatory Visit: Payer: Self-pay | Admitting: Internal Medicine

## 2013-12-16 MED ORDER — FLUCONAZOLE 150 MG PO TABS: 150.0000 mg | ORAL_TABLET | Freq: Once | ORAL | Status: DC

## 2013-12-16 NOTE — Telephone Encounter (Signed)
Pt left v/m; pt was seen 12/05/13 and has taken abx and now pt has yeast infection. Pt request diflucan sent to [facility]. Unable to contact pt to get more info on symptoms of yeast infection.Please advise.

## 2013-12-16 NOTE — Telephone Encounter (Signed)
[facility]. 

## 2013-12-16 NOTE — Telephone Encounter (Signed)
Pt called back and no vaginal discharge and pt has perineal itching; slight burning when urinates and urgency;pt is not sure if burning upon urination is due to perineal skin irritation;  pt does not think UTI; pt request diflucan to [facility].

## 2013-12-17 ENCOUNTER — Other Ambulatory Visit (INDEPENDENT_AMBULATORY_CARE_PROVIDER_SITE_OTHER): Payer: Self-pay

## 2013-12-17 DIAGNOSIS — T85528A Displacement of other gastrointestinal prosthetic devices, implants and grafts, initial encounter: Principal | ICD-10-CM

## 2013-12-17 DIAGNOSIS — IMO0001 Reserved for inherently not codable concepts without codable children: Secondary | ICD-10-CM

## 2013-12-23 ENCOUNTER — Other Ambulatory Visit: Payer: BC Managed Care – PPO

## 2014-01-07 ENCOUNTER — Ambulatory Visit
Admission: RE | Admit: 2014-01-07 | Discharge: 2014-01-07 | Disposition: A | Discharge status: Home / Self Care | Payer: BLUE CROSS/BLUE SHIELD | Source: Ambulatory Visit | Attending: Surgery | Admitting: Surgery

## 2014-01-07 DIAGNOSIS — IMO0001 Reserved for inherently not codable concepts without codable children: Secondary | ICD-10-CM

## 2014-01-07 DIAGNOSIS — T85528A Displacement of other gastrointestinal prosthetic devices, implants and grafts, initial encounter: Principal | ICD-10-CM

## 2014-01-15 ENCOUNTER — Other Ambulatory Visit: Payer: Self-pay | Admitting: Orthopaedic Surgery

## 2014-01-15 DIAGNOSIS — M542 Cervicalgia: Secondary | ICD-10-CM

## 2014-01-21 ENCOUNTER — Other Ambulatory Visit: Payer: BLUE CROSS/BLUE SHIELD

## 2014-05-03 ENCOUNTER — Other Ambulatory Visit: Payer: Self-pay | Admitting: Family Medicine

## 2014-05-04 ENCOUNTER — Other Ambulatory Visit: Payer: Self-pay | Admitting: Family Medicine

## 2014-05-04 NOTE — Telephone Encounter (Signed)
Electronic refill request. Last Filled:    180 tablet 1 RF on 11/10/2013  Please advise.

## 2014-05-05 NOTE — Telephone Encounter (Signed)
Sent. Thanks.   

## 2014-05-07 ENCOUNTER — Ambulatory Visit (INDEPENDENT_AMBULATORY_CARE_PROVIDER_SITE_OTHER): Payer: BLUE CROSS/BLUE SHIELD | Admitting: Primary Care

## 2014-05-07 ENCOUNTER — Encounter: Payer: Self-pay | Admitting: Primary Care

## 2014-05-07 VITALS — BP 122/86 | HR 74 | Temp 98.3°F | Ht 64.5 in | Wt 192.8 lb

## 2014-05-07 DIAGNOSIS — J209 Acute bronchitis, unspecified: Secondary | ICD-10-CM

## 2014-05-07 MED ORDER — HYDROCOD POLST-CPM POLST ER 10-8 MG/5ML PO SUER: 5.0000 mL | Freq: Two times a day (BID) | ORAL | Status: DC | PRN

## 2014-05-07 MED ORDER — DOXYCYCLINE HYCLATE 100 MG PO TABS: 100.0000 mg | ORAL_TABLET | Freq: Two times a day (BID) | ORAL | Status: DC

## 2014-05-07 NOTE — Progress Notes (Signed)
Subjective:    Patient ID: Tiffany Campos, female    DOB: 09/25/1960, 54 y.o.   MRN: [id number]  HPI  Tiffany Campos is a 54 year old female who presents today with a chief complaint of cough. She traveled to [place] 2 weeks ago, and after two days of being there she developed headache, weakness, and nasal congestion. Her symptoms then progressed onto chest congestion several days later, then developed into a productive cough with greenish sputum 10 days ago. Overall her symptoms have worsened and are not dissipating. She's tried taking mucinex with temporary relief. Denies fevers, chills, shortness of breath.  Review of Systems  Constitutional: Positive for appetite change. Negative for fever and chills.  HENT: Positive for congestion, postnasal drip, rhinorrhea, sinus pressure and sore throat. Negative for ear pain.   Respiratory: Negative for shortness of breath.   Cardiovascular: Negative for chest pain.  Gastrointestinal: Positive for diarrhea. Negative for nausea, vomiting and abdominal pain.  Neurological: Positive for headaches. Negative for dizziness.       Past Medical History  Diagnosis Date  . Hypertension   . GERD (gastroesophageal reflux disease)   . Joint pain   . Hyperlipidemia   . Arthritis   . Anxiety   . Diverticulosis   . Kidney stones   . Stress incontinence     History   Social History  . Marital Status: Married    Spouse Name: N/A  . Number of Children: N/A  . Years of Education: N/A   Occupational History  . Not on file.   Social History Main Topics  . Smoking status: Never Smoker   . Smokeless tobacco: Never Used  . Alcohol Use: Yes     Comment: 2 - 3 glasses of wine per day  . Drug Use: No  . Sexual Activity: Not on file   Other Topics Concern  . 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 Tmj    . Cholecystectomy    . Laparoscopic gastric banding   08/03/2009  . Rotator cuff repair  05-25-11    right  . Tonsillectomy and adenoidectomy    . Abdominal hysterectomy    . Ectopic pregnancy surgery      Family History  Problem Relation Age of Onset  . Peripheral vascular disease Father   . GER disease Father   . Diabetes Mother   . Hypertension Mother   . Arthritis Mother   . GER disease Mother   . Cancer Mother     carcinoid tumor    No Known Allergies  Current Outpatient Prescriptions on File Prior to Visit  Medication Sig Dispense Refill  . ALPRAZolam (XANAX) 1 MG tablet TAKE 1 TABLET BY MOUTH 2 TIMES A DAY 60 tablet 3  . etodolac (LODINE) 400 MG tablet TAKE 1 TABLET (400 MG TOTAL) BY MOUTH 2 (TWO) TIMES DAILY. 180 tablet 1  . fluconazole (DIFLUCAN) 150 MG tablet Take 1 tablet (150 mg total) by mouth once. 1 tablet 0  . hydrochlorothiazide (HYDRODIURIL) 25 MG tablet TAKE 1 TABLET (25 MG TOTAL) BY MOUTH DAILY. 90 tablet 1  . pantoprazole (PROTONIX) 40 MG tablet TAKE 1 TABLET (40 MG TOTAL) BY MOUTH DAILY. 90 tablet 1  . [DISCONTINUED] valsartan-hydrochlorothiazide (DIOVAN-HCT) 160-25 MG per tablet Take 1 tablet by mouth daily.       No current facility-administered medications on file prior  to visit.    BP 122/86 mmHg  Pulse 74  Temp(Src) 98.3 F (36.8 C) (Oral)  Ht 5' 4.5" (1.638 m)  Wt 192 lb 12.8 oz (87.454 kg)  BMI 32.60 kg/m2  SpO2 94%    Objective:   Physical Exam  Constitutional: She is oriented to person, place, and time.  HENT:  Right Ear: Tympanic membrane and ear canal normal.  Left Ear: Tympanic membrane and ear canal normal.  Nose: Nose normal.  Mouth/Throat: Oropharynx is clear and moist.  Neck: Neck supple.  Cardiovascular: Normal rate and regular rhythm.   Pulmonary/Chest: Effort normal and breath sounds normal. She has no decreased breath sounds. She has no rhonchi.  Lymphadenopathy:    She has no cervical adenopathy.  Neurological: She is alert and oriented to person, place, and time.  Skin:  Skin is warm and dry.          Assessment & Plan:  Acute Bronchitis:  Symptoms present for 2 weeks, cough for 10 days, now worsening. Lungs mostly clear, no diminished fields, do no suspect pneumonia. Due to duration and worsening symptoms will treat with antibiotics. RX for 10 day course of Doxycycline and Tussionex for cough. Follow up if no improvement in 3-4 days.

## 2014-05-07 NOTE — Progress Notes (Signed)
Pre visit review using our clinic review tool, if applicable. No additional management support is needed unless otherwise documented below in the visit note. 

## 2014-05-07 NOTE — Patient Instructions (Signed)
Start Doxycycline antibiotics. Take 1 tablet by mouth twice daily for 10 days. You may take the Tussionex cough syrup every 12 hours as needed for cough. This medication will make you drowsy. Give me a call if no improvement by Monday. It was nice meeting you!  Acute Bronchitis Bronchitis is inflammation of the airways that extend from the windpipe into the lungs (bronchi). The inflammation often causes mucus to develop. This leads to a cough, which is the most common symptom of bronchitis.  In acute bronchitis, the condition usually develops suddenly and goes away over time, usually in a couple weeks. Smoking, allergies, and asthma can make bronchitis worse. Repeated episodes of bronchitis may cause further lung problems.  CAUSES Acute bronchitis is most often caused by the same virus that causes a cold. The virus can spread from person to person (contagious) through coughing, sneezing, and touching contaminated objects. SIGNS AND SYMPTOMS   Cough.   Fever.   Coughing up mucus.   Body aches.   Chest congestion.   Chills.   Shortness of breath.   Sore throat.  DIAGNOSIS  Acute bronchitis is usually diagnosed through a physical exam. Your health care provider will also ask you questions about your medical history. Tests, such as chest X-rays, are sometimes done to rule out other conditions.  TREATMENT  Acute bronchitis usually goes away in a couple weeks. Oftentimes, no medical treatment is necessary. Medicines are sometimes given for relief of fever or cough. Antibiotic medicines are usually not needed but may be prescribed in certain situations. In some cases, an inhaler may be recommended to help reduce shortness of breath and control the cough. A cool mist vaporizer may also be used to help thin bronchial secretions and make it easier to clear the chest.  HOME CARE INSTRUCTIONS  Get plenty of rest.   Drink enough fluids to keep your urine clear or pale yellow (unless  you have a medical condition that requires fluid restriction). Increasing fluids may help thin your respiratory secretions (sputum) and reduce chest congestion, and it will prevent dehydration.   Take medicines only as directed by your health care provider.  If you were prescribed an antibiotic medicine, finish it all even if you start to feel better.  Avoid smoking and secondhand smoke. Exposure to cigarette smoke or irritating chemicals will make bronchitis worse. If you are a smoker, consider using nicotine gum or skin patches to help control withdrawal symptoms. Quitting smoking will help your lungs heal faster.   Reduce the chances of another bout of acute bronchitis by washing your hands frequently, avoiding people with cold symptoms, and trying not to touch your hands to your mouth, nose, or eyes.   Keep all follow-up visits as directed by your health care provider.  SEEK MEDICAL CARE IF: Your symptoms do not improve after 1 week of treatment.  SEEK IMMEDIATE MEDICAL CARE IF:  You develop an increased fever or chills.   You have chest pain.   You have severe shortness of breath.  You have bloody sputum.   You develop dehydration.  You faint or repeatedly feel like you are going to pass out.  You develop repeated vomiting.  You develop a severe headache. MAKE SURE YOU:   Understand these instructions.  Will watch your condition.  Will get help right away if you are not doing well or get worse. Document Released: 01/27/2004 Document Revised: 05/05/2013 Document Reviewed: 06/11/2012 [facility] Patient Information 2015 [place] [place]. This information is not intended  to replace advice given to you by your health care provider. Make sure you discuss any questions you have with your health care provider.  

## 2014-05-31 ENCOUNTER — Other Ambulatory Visit: Payer: Self-pay | Admitting: Family Medicine

## 2014-06-02 NOTE — Telephone Encounter (Signed)
Last office visit 05/07/2014 with Mayra ReelKate Clark.  Last refilled 12/03/2013 for #60 with 3 refills.  Ok to refill?

## 2014-06-03 NOTE — Telephone Encounter (Signed)
Please call in.  Thanks.   

## 2014-06-03 NOTE — Telephone Encounter (Signed)
Rx called to pharmacy as instructed. 

## 2014-06-15 ENCOUNTER — Encounter: Payer: Self-pay | Admitting: Family Medicine

## 2014-06-15 ENCOUNTER — Ambulatory Visit (INDEPENDENT_AMBULATORY_CARE_PROVIDER_SITE_OTHER): Payer: BLUE CROSS/BLUE SHIELD | Admitting: Family Medicine

## 2014-06-15 VITALS — BP 116/80 | HR 72 | Temp 98.5°F | Wt 194.0 lb

## 2014-06-15 DIAGNOSIS — M158 Other polyosteoarthritis: Secondary | ICD-10-CM | POA: Diagnosis not present

## 2014-06-15 MED ORDER — TRAMADOL HCL 50 MG PO TABS: 50.0000 mg | ORAL_TABLET | Freq: Every day | ORAL | Status: DC | PRN

## 2014-06-15 MED ORDER — CELECOXIB 200 MG PO CAPS: 200.0000 mg | ORAL_CAPSULE | Freq: Every day | ORAL | Status: DC

## 2014-06-15 NOTE — Assessment & Plan Note (Signed)
Worse off nsaids.  Couldn't tolerate lodine or naproxen due to worse gerd sx.  Restart celebrex and continue PPI.  No bloody vomitus or other red flag sx.  abd benign today, soft, not ttp.  She agrees with plan.  PA form done at [facility] and given to patient.

## 2014-06-15 NOTE — Patient Instructions (Signed)
Check with the pharmacy about the PA on celebrex.  I sent the rx in the meantime.   Let me know if I need to work on the PA or if you can't get the med.

## 2014-06-15 NOTE — Progress Notes (Signed)
Pre visit review using our clinic review tool, if applicable. No additional management support is needed unless otherwise documented below in the visit note.  H/o lap band surgery.  Has been on nsaids, and had tolerated it, for OA.  She had recent episodes with walking with acidic taste in her mouth.  It was starting to happen more often.  H/o HH repair with lap band.  Had been on protonix for about 1 year, with nexium prev to that.  Now with dry cough and sensation of a lump in her throat.   Lodine helps with the joint aches.  Now off lodine and GERD sx resolved.  Still on PPI.  Daytime cough is much better. Her joint aches (hands, shoulders, neck) are much worse in the meantime.   She failed treatment with naproxen and lodine, both with months of use.  See above.   Meds, vitals, and allergies reviewed.   ROS: See HPI.  Otherwise, noncontributory.  Uncomfortable but NAD Pain with making R fist and 1st/3rd and then 1st/4th approximation.   rrr abd soft, not ttp

## 2014-08-21 ENCOUNTER — Other Ambulatory Visit: Payer: Self-pay | Admitting: Family Medicine

## 2014-08-21 NOTE — Telephone Encounter (Signed)
Electronic refill request. Last Filled:    60 tablet 1 RF on 06/03/2014  Last office visit:   06/15/14  Please advise.

## 2014-08-23 NOTE — Telephone Encounter (Signed)
Please call in.  Thanks.   

## 2014-08-24 NOTE — Telephone Encounter (Signed)
Medication phoned to pharmacy.  

## 2014-09-12 ENCOUNTER — Other Ambulatory Visit: Payer: Self-pay | Admitting: Family Medicine

## 2014-09-14 NOTE — Telephone Encounter (Signed)
Please call in.  Thanks.   

## 2014-09-14 NOTE — Telephone Encounter (Signed)
Last filled  06/15/2014 #30--Please advise if okay to refill

## 2014-09-15 NOTE — Telephone Encounter (Signed)
Rx has been phone in to [facility]. 

## 2014-09-18 ENCOUNTER — Other Ambulatory Visit: Payer: Self-pay | Admitting: Family Medicine

## 2014-10-30 ENCOUNTER — Other Ambulatory Visit: Payer: Self-pay | Admitting: Family Medicine

## 2014-10-30 DIAGNOSIS — I10 Essential (primary) hypertension: Secondary | ICD-10-CM

## 2014-11-03 ENCOUNTER — Other Ambulatory Visit (INDEPENDENT_AMBULATORY_CARE_PROVIDER_SITE_OTHER): Payer: BLUE CROSS/BLUE SHIELD

## 2014-11-03 ENCOUNTER — Ambulatory Visit (INDEPENDENT_AMBULATORY_CARE_PROVIDER_SITE_OTHER): Payer: BLUE CROSS/BLUE SHIELD | Admitting: Family Medicine

## 2014-11-03 ENCOUNTER — Encounter: Payer: Self-pay | Admitting: Family Medicine

## 2014-11-03 VITALS — BP 140/80 | HR 65 | Temp 99.0°F | Ht 64.5 in | Wt 190.5 lb

## 2014-11-03 DIAGNOSIS — L200 Besnier's prurigo: Secondary | ICD-10-CM | POA: Diagnosis not present

## 2014-11-03 DIAGNOSIS — I10 Essential (primary) hypertension: Secondary | ICD-10-CM

## 2014-11-03 DIAGNOSIS — L239 Allergic contact dermatitis, unspecified cause: Secondary | ICD-10-CM | POA: Insufficient documentation

## 2014-11-03 LAB — LIPID PANEL
CHOLESTEROL: 210 mg/dL — AB (ref 0–200)
HDL: 77.2 mg/dL (ref >39.00)
LDL CALC: 120 mg/dL — AB (ref 0–99)
NONHDL: 133.05
Total CHOL/HDL Ratio: 3
Triglycerides: 66 mg/dL (ref 0.0–149.0)
VLDL: 13.2 mg/dL (ref 0.0–40.0)

## 2014-11-03 LAB — COMPREHENSIVE METABOLIC PANEL
ALBUMIN: 4 g/dL (ref 3.5–5.2)
ALK PHOS: 85 U/L (ref 39–117)
ALT: 21 U/L (ref 0–35)
AST: 22 U/L (ref 0–37)
BUN: 14 mg/dL (ref 6–23)
CALCIUM: 9.4 mg/dL (ref 8.4–10.5)
CHLORIDE: 104 meq/L (ref 96–112)
CO2: 30 mEq/L (ref 19–32)
Creatinine, Ser: 0.79 mg/dL (ref 0.40–1.20)
GFR: 80.11 mL/min (ref >60.00)
GLUCOSE: 88 mg/dL (ref 70–99)
POTASSIUM: 3.7 meq/L (ref 3.5–5.1)
SODIUM: 141 meq/L (ref 135–145)
TOTAL PROTEIN: 6.5 g/dL (ref 6.0–8.3)
Total Bilirubin: 1 mg/dL (ref 0.2–1.2)

## 2014-11-03 NOTE — Assessment & Plan Note (Signed)
UNknown trigger. Resolving. Treat with antihistamine, can use topical steroid if needed for itching.

## 2014-11-03 NOTE — Patient Instructions (Signed)
Start antihistamine like claritin or benadryl as needed.  Can use topical triamcinolone on hydrocortisone OTC.  Call if swelling in mouth returns.. Go to ER if shortness of breath.

## 2014-11-03 NOTE — Progress Notes (Signed)
   Subjective:    Patient ID: Tiffany Campos, female    DOB: 11/07/1960, 54 y.o.   MRN: [id number]  Rash This is a new problem. The current episode started in the past 7 days (3 days, occured on trip, went to son's house). The problem has been gradually improving (no swelling in mouth now.) since onset. The affected locations include the chest, torso, back and left upper leg. The rash is characterized by redness, itchiness and swelling (raised, pink, patches at waist band area.). Pertinent negatives include no congestion, fever or shortness of breath. ( Started as lip tingling, area under tounge swollen, skin itching, chin tight) Past treatments include oral steroids and topical steroids (She has recently been on  7 days steroid (40, 40,20, 20, 10, 10 ,5  mg)last week for poision ivy). The treatment provided mild relief. There is no history of allergies, asthma, eczema or varicella.    Was on trip, stayed at sons, same detergent. No new mediction prescription.  Review of Systems  Constitutional: Negative for fever.  HENT: Negative for congestion.   Respiratory: Negative for shortness of breath.   Skin: Positive for rash.       Objective:   Physical Exam  Constitutional: Vital signs are normal. She appears well-developed and well-nourished. She is cooperative.  Non-toxic appearance. She does not appear ill. No distress.  HENT:  Head: Normocephalic.  Right Ear: Hearing, tympanic membrane, external ear and ear canal normal. Tympanic membrane is not erythematous, not retracted and not bulging.  Left Ear: Hearing, tympanic membrane, external ear and ear canal normal. Tympanic membrane is not erythematous, not retracted and not bulging.  Nose: No mucosal edema or rhinorrhea. Right sinus exhibits no maxillary sinus tenderness and no frontal sinus tenderness. Left sinus exhibits no maxillary sinus tenderness and no frontal sinus tenderness.  Mouth/Throat: Uvula is midline, oropharynx is clear and  moist and mucous membranes are normal.  No lip or tounge swelling  Eyes: Conjunctivae, EOM and lids are normal. Pupils are equal, round, and reactive to light. Lids are everted and swept, no foreign bodies found.  Neck: Trachea normal and normal range of motion. Neck supple. Carotid bruit is not present. No thyroid mass and no thyromegaly present.  Cardiovascular: Normal rate, regular rhythm, S1 normal, S2 normal, normal heart sounds, intact distal pulses and normal pulses.  Exam reveals no gallop and no friction rub.   No murmur heard. Pulmonary/Chest: Effort normal and breath sounds normal. No tachypnea. No respiratory distress. She has no decreased breath sounds. She has no wheezes. She has no rhonchi. She has no rales.  Abdominal: Soft. Normal appearance and bowel sounds are normal. There is no tenderness.  Neurological: She is alert.  Skin: Skin is warm, dry and intact. No rash noted.  Slight erythema around waist, resolving   pt had pictures of rash.. Appeared like hives at waistline.  Psychiatric: Her speech is normal and behavior is normal. Judgment and thought content normal. Her mood appears not anxious. Cognition and memory are normal. She does not exhibit a depressed mood.          Assessment & Plan:

## 2014-11-03 NOTE — Progress Notes (Signed)
Pre visit review using our clinic review tool, if applicable. No additional management support is needed unless otherwise documented below in the visit note. 

## 2014-11-04 ENCOUNTER — Telehealth: Payer: Self-pay | Admitting: Family Medicine

## 2014-11-04 ENCOUNTER — Ambulatory Visit: Payer: Self-pay | Admitting: Family Medicine

## 2014-11-04 MED ORDER — TRIAMCINOLONE ACETONIDE 0.1 % EX CREA: 1.0000 "application " | TOPICAL_CREAM | Freq: Two times a day (BID) | CUTANEOUS | Status: DC | PRN

## 2014-11-04 NOTE — Telephone Encounter (Signed)
Pt was in to see Dr. Ermalene SearingBedsole yesterday for a rash and was told to take comfort measures for it.  Pt has Triamcinolone Acetonide 1% which Dr. Ermalene SearingBedsole told her to continue using, however, pt told Dr. Ermalene SearingBedsole she had cream at home, but when she got home she realized she doesn't have any.  Can you [facility] in [facility] [address]?  Please advise.

## 2014-11-04 NOTE — Telephone Encounter (Signed)
Sent. Thanks.   

## 2014-11-04 NOTE — Telephone Encounter (Signed)
Patient Name: Tiffany Campos  DOB: 05/09/1960    Initial Comment Caller states she was seen yesterday for rash, but it resolved before she got to the appt. Now has itching around ribcage with rash of raised red welts.   Nurse Assessment  Nurse: Sherilyn CooterHenry, RN, Thurmond ButtsWade Date/Time Lamount Cohen(Eastern Time): 11/04/2014 11:16:03 AM  Confirm and document reason for call. If symptomatic, describe symptoms. ---Caller states that she has an itchy red raised welts around her ribcage area that began on Monday. She went to office yesterday, but the rash had disappeared. It is back again. It is more on the left side. She had some Triamcinolone cream from before, but its nearly empty.  Has the patient traveled out of the country within the last 30 days? ---No  Does the patient have any new or worsening symptoms? ---Yes  Will a triage be completed? ---Yes  Related visit to physician within the last 2 weeks? ---Yes  Does the PT have any chronic conditions? (i.e. diabetes, asthma, etc.) ---Unknown     Guidelines    Guideline Title Affirmed Question Affirmed Notes       Final Disposition User        Comments  Appointment made with Dr. Para Marchuncan at 5:00pm.

## 2014-11-04 NOTE — Telephone Encounter (Signed)
Pt has appt 11/04/14 at 5 PM with Dr Para Marchuncan.

## 2014-11-04 NOTE — Telephone Encounter (Signed)
Patient notified by telephone that script has been sent to the pharmacy. 

## 2014-11-10 ENCOUNTER — Encounter: Payer: Self-pay | Admitting: Family Medicine

## 2014-11-10 ENCOUNTER — Ambulatory Visit (INDEPENDENT_AMBULATORY_CARE_PROVIDER_SITE_OTHER): Payer: BLUE CROSS/BLUE SHIELD | Admitting: Family Medicine

## 2014-11-10 ENCOUNTER — Other Ambulatory Visit: Payer: Self-pay | Admitting: Family Medicine

## 2014-11-10 VITALS — BP 112/74 | HR 83 | Temp 98.1°F | Wt 190.0 lb

## 2014-11-10 DIAGNOSIS — F411 Generalized anxiety disorder: Secondary | ICD-10-CM

## 2014-11-10 DIAGNOSIS — R21 Rash and other nonspecific skin eruption: Secondary | ICD-10-CM

## 2014-11-10 DIAGNOSIS — Z119 Encounter for screening for infectious and parasitic diseases, unspecified: Secondary | ICD-10-CM | POA: Diagnosis not present

## 2014-11-10 DIAGNOSIS — I10 Essential (primary) hypertension: Secondary | ICD-10-CM

## 2014-11-10 DIAGNOSIS — Z7189 Other specified counseling: Secondary | ICD-10-CM

## 2014-11-10 DIAGNOSIS — Z0000 Encounter for general adult medical examination without abnormal findings: Secondary | ICD-10-CM

## 2014-11-10 DIAGNOSIS — M158 Other polyosteoarthritis: Secondary | ICD-10-CM

## 2014-11-10 DIAGNOSIS — Z9884 Bariatric surgery status: Secondary | ICD-10-CM

## 2014-11-10 MED ORDER — HYDROCHLOROTHIAZIDE 25 MG PO TABS: ORAL_TABLET | ORAL | Status: DC

## 2014-11-10 MED ORDER — CELECOXIB 200 MG PO CAPS: 200.0000 mg | ORAL_CAPSULE | Freq: Every day | ORAL | Status: DC

## 2014-11-10 MED ORDER — TRAMADOL HCL 50 MG PO TABS: 50.0000 mg | ORAL_TABLET | Freq: Every day | ORAL | Status: DC | PRN

## 2014-11-10 MED ORDER — ALPRAZOLAM 1 MG PO TABS: 1.0000 mg | ORAL_TABLET | Freq: Two times a day (BID) | ORAL | Status: DC | PRN

## 2014-11-10 MED ORDER — TRIAMCINOLONE ACETONIDE 0.1 % EX CREA: 1.0000 "application " | TOPICAL_CREAM | Freq: Two times a day (BID) | CUTANEOUS | Status: DC | PRN

## 2014-11-10 MED ORDER — PANTOPRAZOLE SODIUM 40 MG PO TBEC: DELAYED_RELEASE_TABLET | ORAL | Status: DC

## 2014-11-10 NOTE — Progress Notes (Signed)
CPE- See plan.  Routine anticipatory guidance given to patient.  See health maintenance. Tetanus 2011 Flu 2016 PNA and shingles not due.  D/w pt.  Colonoscopy 2011 Pap not due DXA not due Mammogram d/w pt.  Pending.   Living will d/w pt.  Husband designated if patient were incapacitated.   Diet and exercise d/w pt.  Limited exercise, encouraged more. She is working on her weight.  Labs d/w pt.  Pt opts in for HCV screening.  D/w pt re: routine screening.  Declined HIV screening.   Hypertension:    Using medication without problems or lightheadedness: yes Chest pain with exertion:no Edema:no Short of breath:no  Anxiety.  Noted caring for her mother.  No ADE on med.  Taking prn, 1/2 in the day per dose, 1 at night.   B rash, didn't resolve fully.  Had blanching round lesions.  It was itchy.  It was itchy.  She had a poison ivy exposure.  She was prev treated with prednisone and that may have helped some.  Her legs are better.  Her trunk is better now.  No other clear trigger.  She has a few lesions on her trunk and forehead.    She has f/u with Dr. Corliss Skainseveshwar pending with rheum/ortho.   She has f/u with [facility] pending re: prev gastric band.    PMH and SH reviewed  Meds, vitals, and allergies reviewed.   ROS: See HPI.  Otherwise negative.    GEN: nad, alert and oriented HEENT: mucous membranes moist NECK: supple w/o LA CV: rrr. PULM: ctab, no inc wob ABD: soft, +bs EXT: no edema SKIN: no acute rash but non acute round blanching faint macules noted on the back and one on the L side of the forehead.

## 2014-11-10 NOTE — Patient Instructions (Addendum)
If the rash is getting better, then I would give it a little longer.   Don't change your meds for now.   Update me in about 1 week if not better.  Go to the lab on the way out.  We'll contact you with your lab report. Take care.  Glad to see you.

## 2014-11-11 DIAGNOSIS — R21 Rash and other nonspecific skin eruption: Secondary | ICD-10-CM | POA: Insufficient documentation

## 2014-11-11 DIAGNOSIS — Z7189 Other specified counseling: Secondary | ICD-10-CM | POA: Insufficient documentation

## 2014-11-11 DIAGNOSIS — Z0000 Encounter for general adult medical examination without abnormal findings: Secondary | ICD-10-CM | POA: Insufficient documentation

## 2014-11-11 LAB — HEPATITIS C ANTIBODY: HCV Ab: NEGATIVE

## 2014-11-11 NOTE — Assessment & Plan Note (Signed)
Has f/u with surgery clinic pending, routine f/u.

## 2014-11-11 NOTE — Assessment & Plan Note (Signed)
Tetanus 2011 Flu 2016 PNA and shingles not due.  D/w pt.  Colonoscopy 2011 Pap not due DXA not due Mammogram d/w pt.  Pending.   Living will d/w pt.  Husband designated if patient were incapacitated.   Diet and exercise d/w pt.  Limited exercise, encouraged more. She is working on her weight.  Labs d/w pt.  Pt opts in for HCV screening.  D/w pt re: routine screening.  Declined HIV screening.

## 2014-11-11 NOTE — Assessment & Plan Note (Signed)
Continue current meds.  No ADE. She has f/u with rheum pending.

## 2014-11-11 NOTE — Assessment & Plan Note (Signed)
Noted caring for her mother. No ADE on med. Taking prn, 1/2 in the day per dose, 1 at night.  No ade on med.  Continue as is.  Okay for outpatient f/u.

## 2014-11-11 NOTE — Assessment & Plan Note (Signed)
Since it is some better, then I would give it a little longer.  Don't change meds for now.  Update me in about 1 week if not better.  She agrees.   Unclear source but appears to be a benign and resolving process.

## 2014-11-13 ENCOUNTER — Encounter: Payer: Self-pay | Admitting: Family Medicine

## 2014-11-18 ENCOUNTER — Encounter: Payer: Self-pay | Admitting: Internal Medicine

## 2014-11-18 ENCOUNTER — Ambulatory Visit: Payer: BLUE CROSS/BLUE SHIELD | Admitting: Internal Medicine

## 2014-11-18 ENCOUNTER — Ambulatory Visit (INDEPENDENT_AMBULATORY_CARE_PROVIDER_SITE_OTHER): Payer: BLUE CROSS/BLUE SHIELD | Admitting: Internal Medicine

## 2014-11-18 VITALS — BP 126/82 | HR 53 | Temp 98.2°F | Wt 194.0 lb

## 2014-11-18 DIAGNOSIS — R21 Rash and other nonspecific skin eruption: Secondary | ICD-10-CM

## 2014-11-18 MED ORDER — TRIAMCINOLONE ACETONIDE 0.1 % EX CREA: 1.0000 "application " | TOPICAL_CREAM | Freq: Two times a day (BID) | CUTANEOUS | Status: DC | PRN

## 2014-11-18 MED ORDER — PREDNISONE 20 MG PO TABS: ORAL_TABLET | ORAL | Status: DC

## 2014-11-18 NOTE — Patient Instructions (Signed)

## 2014-11-18 NOTE — Progress Notes (Signed)
Subjective:    Patient ID: Tiffany Campos, female    DOB: March 03, 1960, 54 y.o.   MRN: [id number]  HPI  Pt presents to the clinic today with c/o rash. This started 3 weeks ago. It has been intermittent since that time. She saw Dr. Diona Browner 11/1 for the same. Advised to take an antihistamine OTC and given RX for Kenalog cream. She saw Dr. Damita Dunnings for her annual 11/8, and had noticed some improvement but no resolution of the rash. He advised her to continue the current treatment. She reports the rash is located on her abdomen, back and down her legs. The rash is itchy but not as bad as before. She denies changes in soaps, lotions or detergents. She denies changes in diet or medication.  Review of Systems  Past Medical History  Diagnosis Date  . Hypertension   . GERD (gastroesophageal reflux disease)   . Joint pain   . Hyperlipidemia   . Arthritis   . Anxiety   . Diverticulosis   . Kidney stones   . Stress incontinence     Current Outpatient Prescriptions  Medication Sig Dispense Refill  . ALPRAZolam (XANAX) 1 MG tablet Take 1 tablet (1 mg total) by mouth 2 (two) times daily as needed for anxiety. 60 tablet 1  . celecoxib (CELEBREX) 200 MG capsule Take 1 capsule (200 mg total) by mouth daily. 90 capsule 1  . hydrochlorothiazide (HYDRODIURIL) 25 MG tablet TAKE 1 TABLET (25 MG TOTAL) BY MOUTH DAILY. 90 tablet 3  . loratadine (CLARITIN) 10 MG tablet Take 10 mg by mouth daily.    . pantoprazole (PROTONIX) 40 MG tablet TAKE 1 TABLET (40 MG TOTAL) BY MOUTH DAILY. 90 tablet 3  . traMADol (ULTRAM) 50 MG tablet Take 1 tablet (50 mg total) by mouth daily as needed. 30 tablet 1  . triamcinolone cream (KENALOG) 0.1 % Apply 1 application topically 2 (two) times daily as needed. (Patient not taking: Reported on 11/18/2014) 30 g 1  . [DISCONTINUED] valsartan-hydrochlorothiazide (DIOVAN-HCT) 160-25 MG per tablet Take 1 tablet by mouth daily.       No current facility-administered medications for this  visit.    Allergies  Allergen Reactions  . Lodine [Etodolac] Other (See Comments)    GERD- tolerates celebrex  . Naproxen Other (See Comments)    GERD- tolerates celebrex    Family History  Problem Relation Age of Onset  . Peripheral vascular disease Father   . GER disease Father   . Diabetes Mother   . Hypertension Mother   . Arthritis Mother   . GER disease Mother   . Cancer Mother     carcinoid tumor  . Colon cancer Maternal Uncle     possible colon cancer dx  . Breast cancer Neg Hx     Social History   Social History  . Marital Status: Married    Spouse Name: N/A  . Number of Children: N/A  . Years of Education: N/A   Occupational History  . Not on file.   Social History Main Topics  . Smoking status: Never Smoker   . Smokeless tobacco: Never Used  . Alcohol Use: Yes     Comment: 2  glasses of wine per day  . Drug Use: No  . Sexual Activity: Not on file   Other Topics Concern  . Not on file   Social History Narrative   Retired Therapist, sports   2nd marriage 1983     Constitutional: Denies fever, malaise, fatigue, headache  or abrupt weight changes.  Respiratory: Denies difficulty breathing, shortness of breath, cough or sputum production.   Cardiovascular: Denies chest pain, chest tightness, palpitations or swelling in the hands or feet.  Skin: Pt reports rash. Denies redness, or ulcercations.    No other specific complaints in a complete review of systems (except as listed in HPI above).     Objective:   Physical Exam  BP 126/82 mmHg  Pulse 53  Temp(Src) 98.2 F (36.8 C) (Oral)  Wt 194 lb (87.998 kg)  SpO2 98% Wt Readings from Last 3 Encounters:  11/18/14 194 lb (87.998 kg)  11/10/14 190 lb (86.183 kg)  11/03/14 190 lb 8 oz (86.41 kg)    General: Appears her stated age, well developed, well nourished in NAD. Skin: Scattered, round, maculopapular rash noted around her waistline. Lesions are > 1 cm. Some are convalescent. Rash is  blanchable. Cardiovascular: Normal rate and rhythm. S1,S2 noted.  No murmur, rubs or gallops noted.  Pulmonary/Chest: Normal effort and positive vesicular breath sounds. No respiratory distress. No wheezes, rales or ronchi noted.   BMET    Component Value Date/Time   NA 141 11/03/2014 1038   K 3.7 11/03/2014 1038   CL 104 11/03/2014 1038   CO2 30 11/03/2014 1038   GLUCOSE 88 11/03/2014 1038   BUN 14 11/03/2014 1038   CREATININE 0.79 11/03/2014 1038   CALCIUM 9.4 11/03/2014 1038   GFRNONAA >60 07/30/2009 0830   GFRAA  07/30/2009 0830    >60        The eGFR has been calculated using the MDRD equation. This calculation has not been validated in all clinical situations. eGFR's persistently <60 mL/min signify possible Chronic Kidney Disease.    Lipid Panel     Component Value Date/Time   CHOL 210* 11/03/2014 1038   TRIG 66.0 11/03/2014 1038   HDL 77.20 11/03/2014 1038   CHOLHDL 3 11/03/2014 1038   VLDL 13.2 11/03/2014 1038   LDLCALC 120* 11/03/2014 1038    CBC    Component Value Date/Time   WBC 10.3 08/04/2009 0535   RBC 4.03 08/04/2009 0535   HGB 12.9 08/04/2009 0535   HCT 38.0 08/04/2009 0535   PLT 202 08/04/2009 0535   MCV 94.1 08/04/2009 0535   MCH 32.0 08/04/2009 0535   MCHC 34.0 08/04/2009 0535   RDW 13.0 08/04/2009 0535   LYMPHSABS 1.2 08/04/2009 0535   MONOABS 0.7 08/04/2009 0535   EOSABS 0.0 08/04/2009 0535   BASOSABS 0.0 08/04/2009 0535    Hgb A1C No results found for: HGBA1C       Assessment & Plan:   Rash:  Kenalog cream has not been effective but she does want a refill of this today She should continue her antihistamine eRx for Pred Taper  If symptoms persist, follow up with dermatology  RTC as needed or if symptoms persist or worsen

## 2014-11-18 NOTE — Progress Notes (Signed)
Pre visit review using our clinic review tool, if applicable. No additional management support is needed unless otherwise documented below in the visit note. 

## 2014-12-19 ENCOUNTER — Other Ambulatory Visit: Payer: Self-pay | Admitting: Family Medicine

## 2015-01-10 ENCOUNTER — Other Ambulatory Visit: Payer: Self-pay | Admitting: Family Medicine

## 2015-01-11 NOTE — Telephone Encounter (Signed)
Electronic refill request. Last Filled:    60 tablet 1 11/10/2014  Last office visit:   11/10/14  Please advise.

## 2015-01-12 NOTE — Telephone Encounter (Signed)
Please call in.  Thanks.   

## 2015-01-12 NOTE — Telephone Encounter (Signed)
Medication phoned to pharmacy.  

## 2015-03-27 ENCOUNTER — Other Ambulatory Visit: Payer: Self-pay | Admitting: Family Medicine

## 2015-03-29 NOTE — Telephone Encounter (Signed)
Electronic refill request. Last Filled:   Alprazolam #60 1 RF on 01/12/15 Last Filled:   Tramadol #30 1 RF on 11/10/14   Last CPE:  11/10/14   Please advise.

## 2015-03-30 NOTE — Telephone Encounter (Signed)
Please call in both.  Thanks.  

## 2015-03-30 NOTE — Telephone Encounter (Signed)
Medication phoned to pharmacy.  

## 2015-03-31 ENCOUNTER — Other Ambulatory Visit: Payer: Self-pay | Admitting: Family Medicine

## 2015-03-31 NOTE — Telephone Encounter (Signed)
Received refill electronically Last refill 11/10/14 #90/1 Last office visit 11/18/14/acute Is it okay to refill?

## 2015-04-01 NOTE — Telephone Encounter (Signed)
Medication phoned to pharmacy.  

## 2015-04-01 NOTE — Telephone Encounter (Signed)
Please call in.  Thanks.   

## 2015-06-18 ENCOUNTER — Other Ambulatory Visit: Payer: Self-pay | Admitting: Family Medicine

## 2015-06-18 NOTE — Telephone Encounter (Signed)
Electronic refill request. Last Filled:    60 tablet 1 03/30/2015  Last office visit:   11/10/14 CPE  Please advise.

## 2015-06-20 NOTE — Telephone Encounter (Signed)
Please call in.  Thanks.   

## 2015-06-21 ENCOUNTER — Other Ambulatory Visit: Payer: Self-pay | Admitting: Family Medicine

## 2015-06-21 NOTE — Telephone Encounter (Signed)
Rx called in as prescribed 

## 2015-07-15 ENCOUNTER — Other Ambulatory Visit: Payer: Self-pay | Admitting: Family Medicine

## 2015-07-15 NOTE — Telephone Encounter (Signed)
Electronic refill request. Last Filled:    30 tablet 1 03/30/2015  Last office visit:   CPE 11/10/14  Please advise.

## 2015-07-16 ENCOUNTER — Encounter: Payer: Self-pay | Admitting: Emergency Medicine

## 2015-07-16 ENCOUNTER — Emergency Department: Payer: BLUE CROSS/BLUE SHIELD

## 2015-07-16 ENCOUNTER — Emergency Department
Admission: EM | Admit: 2015-07-16 | Discharge: 2015-07-16 | Disposition: A | Discharge status: Home / Self Care | Payer: BLUE CROSS/BLUE SHIELD | Attending: Emergency Medicine | Admitting: Emergency Medicine

## 2015-07-16 DIAGNOSIS — E785 Hyperlipidemia, unspecified: Secondary | ICD-10-CM | POA: Insufficient documentation

## 2015-07-16 DIAGNOSIS — E876 Hypokalemia: Secondary | ICD-10-CM | POA: Insufficient documentation

## 2015-07-16 DIAGNOSIS — M1990 Unspecified osteoarthritis, unspecified site: Secondary | ICD-10-CM | POA: Diagnosis not present

## 2015-07-16 DIAGNOSIS — I10 Essential (primary) hypertension: Secondary | ICD-10-CM | POA: Diagnosis not present

## 2015-07-16 DIAGNOSIS — R55 Syncope and collapse: Secondary | ICD-10-CM | POA: Diagnosis present

## 2015-07-16 DIAGNOSIS — Z79899 Other long term (current) drug therapy: Secondary | ICD-10-CM | POA: Insufficient documentation

## 2015-07-16 LAB — BASIC METABOLIC PANEL
ANION GAP: 8 (ref 5–15)
BUN: 13 mg/dL (ref 6–20)
CHLORIDE: 104 mmol/L (ref 101–111)
CO2: 26 mmol/L (ref 22–32)
Calcium: 9.4 mg/dL (ref 8.9–10.3)
Creatinine, Ser: 0.7 mg/dL (ref 0.44–1.00)
GFR calc non Af Amer: >60 mL/min (ref >60)
Glucose, Bld: 99 mg/dL (ref 65–99)
POTASSIUM: 3.1 mmol/L — AB (ref 3.5–5.1)
SODIUM: 138 mmol/L (ref 135–145)

## 2015-07-16 LAB — CBC WITH DIFFERENTIAL/PLATELET
BASOS ABS: 0.1 10*3/uL (ref 0–0.1)
Basophils Relative: 1 %
EOS PCT: 2 %
Eosinophils Absolute: 0.2 10*3/uL (ref 0–0.7)
HCT: 47 % (ref 35.0–47.0)
HEMOGLOBIN: 15.9 g/dL (ref 12.0–16.0)
LYMPHS ABS: 2 10*3/uL (ref 1.0–3.6)
LYMPHS PCT: 28 %
MCH: 32.3 pg (ref 26.0–34.0)
MCHC: 33.8 g/dL (ref 32.0–36.0)
MCV: 95.4 fL (ref 80.0–100.0)
Monocytes Absolute: 0.6 10*3/uL (ref 0.2–0.9)
Monocytes Relative: 8 %
Neutro Abs: 4.3 10*3/uL (ref 1.4–6.5)
Neutrophils Relative %: 61 %
PLATELETS: 203 10*3/uL (ref 150–440)
RBC: 4.93 MIL/uL (ref 3.80–5.20)
RDW: 12.4 % (ref 11.5–14.5)
WBC: 7.1 10*3/uL (ref 3.6–11.0)

## 2015-07-16 LAB — URINALYSIS COMPLETE WITH MICROSCOPIC (ARMC ONLY)
Bacteria, UA: NONE SEEN
Bilirubin Urine: NEGATIVE
Glucose, UA: >500 mg/dL — AB
Hgb urine dipstick: NEGATIVE
KETONES UR: NEGATIVE mg/dL
LEUKOCYTES UA: NEGATIVE
Nitrite: NEGATIVE
PH: 5 (ref 5.0–8.0)
PROTEIN: NEGATIVE mg/dL
SPECIFIC GRAVITY, URINE: 1.035 — AB (ref 1.005–1.030)
Squamous Epithelial / LPF: NONE SEEN

## 2015-07-16 LAB — TROPONIN I

## 2015-07-16 MED ORDER — POTASSIUM CHLORIDE CRYS ER 20 MEQ PO TBCR
40.0000 meq | EXTENDED_RELEASE_TABLET | Freq: Once | ORAL | Status: AC
  Administered 2015-07-16: 40 meq via ORAL
  Filled 2015-07-16: qty 2

## 2015-07-16 NOTE — Telephone Encounter (Signed)
Please call in.  Thanks.   

## 2015-07-16 NOTE — ED Provider Notes (Signed)
[facility] Emergency Department Provider Note   ____________________________________________  Time seen:  I have reviewed the triage vital signs and the triage nursing note.  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  She felt lightheaded and could hear her pulse in her ears, without any palpitations or chest pain or trouble breathing. She's had some headaches and has a mild headache now. She states that for the past several weeks or months she's actually had some word finding problems which she has not had evaluated yet. Her husband reports that she was having some slurred speech after she nearly passed out which is better now. No focal weakness or numbness.  She did not fully pass out and she has no traumatic injury from today.  EMS reported normal blood glucose and no hypotension with orthostatic blood pressure management.    Past Medical History  Diagnosis Date  . Hypertension   . GERD (gastroesophageal reflux disease)   . Joint pain   . Hyperlipidemia   . Arthritis   . Anxiety   . Diverticulosis   . Kidney stones   . Stress incontinence     Patient Active Problem List   Diagnosis Date Noted  . Routine general medical examination at a health care facility 11/11/2014  . Advance care planning 11/11/2014  . Rash and nonspecific skin eruption 11/11/2014  . Allergic dermatitis 11/03/2014  . Obesity 11/06/2013  . Essential hypertension 11/06/2013  . OA (osteoarthritis) 11/06/2013  . Generalized anxiety disorder 11/06/2013  . GERD (gastroesophageal reflux disease) 11/06/2013  . Lapband APS + [facility] repair August 2011 03/13/2013    Past Surgical History  Procedure Laterality Date  . Tmj    . Cholecystectomy    . Laparoscopic gastric banding  08/03/2009  . Rotator  cuff repair  05-25-11    right, with distal clavicle resection  . Tonsillectomy and adenoidectomy    . Abdominal hysterectomy    . Ectopic pregnancy surgery      Current Outpatient Rx  Name  Route  Sig  Dispense  Refill  . ALPRAZolam (XANAX) 1 MG tablet      TAKE 1 TABLET BY MOUTH TWICE DAILY AS NEEDED FOR ANXIETY   60 tablet   1     Not to exceed 4 additional fills before 07/11/2015 ...   . celecoxib (CELEBREX) 200 MG capsule      TAKE 1 CAPSULE (200 MG TOTAL) BY MOUTH DAILY.   90 capsule   1   . hydrochlorothiazide (HYDRODIURIL) 25 MG tablet      TAKE 1 TABLET (25 MG TOTAL) BY MOUTH DAILY.   90 tablet   3   . loratadine (CLARITIN) 10 MG tablet   Oral   Take 10 mg by mouth daily.         . pantoprazole (PROTONIX) 40 MG tablet      TAKE 1 TABLET (40 MG TOTAL) BY MOUTH DAILY.   90 tablet   3   . predniSONE (DELTASONE) 20 MG tablet      Take 3 tabs on days 1-4, take 2 tabs on days 5-8, take 1 tab on days 9-12   24 tablet   0   . traMADol (ULTRAM) 50 MG tablet      TAKE 1 TABLET BY MOUTH DAILY AS NEEDED  30 tablet   1     Not to exceed 4 additional fills before 09/26/2015 ...   . triamcinolone cream (KENALOG) 0.1 %   Topical   Apply 1 application topically 2 (two) times daily as needed.   80 g   0     Allergies Lodine and Naproxen  Family History  Problem Relation Age of Onset  . Peripheral vascular disease Father   . GER disease Father   . Diabetes Mother   . Hypertension Mother   . Arthritis Mother   . GER disease Mother   . Cancer Mother     carcinoid tumor  . Colon cancer Maternal Uncle     possible colon cancer dx  . Breast cancer Neg Hx     Social History Social History  Substance Use Topics  . Smoking status: Never Smoker   . Smokeless tobacco: Never Used  . Alcohol Use: Yes     Comment: 2  glasses of wine per day    Review of Systems  Constitutional: Negative for fever. Eyes: Negative for visual changes. ENT: Negative  for sore throat. Cardiovascular: Negative for chest pain. Respiratory: Negative for shortness of breath. Gastrointestinal: Negative for abdominal pain, vomiting and diarrhea. Genitourinary: Negative for dysuria. Musculoskeletal: Negative for back pain. Skin: Negative for rash. Neurological: Positive for headache. 10 point Review of Systems otherwise negative ____________________________________________   PHYSICAL EXAM:  VITAL SIGNS: ED Triage Vitals  Enc Vitals Group     BP 07/16/15 1644 134/86 mmHg     Pulse Rate 07/16/15 1644 63     Resp 07/16/15 1644 16     Temp 07/16/15 1644 97.7 F (36.5 C)     Temp Source 07/16/15 1644 Oral     SpO2 07/16/15 1644 97 %     Weight 07/16/15 1644 186 lb (84.369 kg)     Height 07/16/15 1644 5\' 6"  (1.676 m)     Head Cir --      Peak Flow --      Pain Score --      Pain Loc --      Pain Edu? --      Excl. in GC? --      Constitutional: Alert and oriented. Well appearing and in no distress. HEENT   Head: Normocephalic and atraumatic.      Eyes: Conjunctivae are normal. PERRL. Normal extraocular movements.      Ears:         Nose: No congestion/rhinnorhea.   Mouth/Throat: Mucous membranes are moist.   Neck: No stridor. Cardiovascular/Chest: Normal rate, regular rhythm.  No murmurs, rubs, or gallops. Respiratory: Normal respiratory effort without tachypnea nor retractions. Breath sounds are clear and equal bilaterally. No wheezes/rales/rhonchi. Gastrointestinal: Soft. No distention, no guarding, no rebound. Nontender.    Genitourinary/rectal:Deferred Musculoskeletal: Nontender with normal range of motion in all extremities. No joint effusions.  No lower extremity tenderness.  No edema. Neurologic:  Mild droop at left lip line with parasthesia at that area.  Some hesitancy to find words, but appropriate.  Normal language. No gross or focal neurologic deficits are appreciated of 4 extremities. Skin:  Skin is warm, dry and intact.  No rash noted. Psychiatric: Mood and affect are normal. Speech and behavior are normal. Patient exhibits appropriate insight and judgment.  ____________________________________________   EKG I, Governor Rooks, MD, the attending physician have personally viewed and interpreted all ECGs.  65 bpm. Normal sinus rhythm. Narrow QRS. Normal axis. Normal ST and T-wave ____________________________________________  LABS (pertinent positives/negatives)  Labs Reviewed  BASIC METABOLIC PANEL - Abnormal; Notable for the following:    Potassium 3.1 (*)    All other components within normal limits  URINALYSIS COMPLETEWITH MICROSCOPIC ([facility] ONLY) - Abnormal; Notable for the following:    Color, Urine STRAW (*)    APPearance CLEAR (*)    Glucose, UA >500 (*)    Specific Gravity, Urine 1.035 (*)    All other components within normal limits  TROPONIN I  CBC WITH DIFFERENTIAL/PLATELET    ____________________________________________  RADIOLOGY All Xrays were viewed by me. Imaging interpreted by Radiologist.  CT without contrast:  Normal head CT.  MRI brain:  IMPRESSION: Normal MRI of the brain for age. __________________________________________  PROCEDURES  Procedure(s) performed: None  Critical Care performed: None  ____________________________________________   ED COURSE / ASSESSMENT AND PLAN  Pertinent labs & imaging results that were available during my care of the patient were reviewed by me and considered in my medical decision making (see chart for details).   The episode sounds like potentially vasovagal, uncertain initiating event. No full syncope or traumatic injury. She doesn't give a history that sounds concerning for an emergency neurologic or cardiac event.  Although the husband states that she had some slurred speech when she was nearly passed out, I think this is consistent with a vasovagal event rather than a TIA. However given that she has had some headaches which  are unusual for her and recent several months of word finding troubles without prior evaluation or head imaging, I discussed best with them obtaining head CT today.  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  Pt feels it is the same as typical.   Laboratory studies are reassuring in terms of no urinary tract infection, CBC within normal limits without elevated white blood cell count, and a normal hemoglobin Troponin is negative and EKG is reassuring.  On her metabolic panel, she does have a slightly low potassium 3.1 and she was given repletion.  CT head negative.  Upon discussion of results with the patient, I discussed with her again the left facial droop, and she stated that she feels like that left lip feels a little funny, but she is not sure if she is just being hypersensitive for now. Her husband looked better and states "I don't normally listen to her ", but stated that he thought may be the droop was a little worse. Based off of this discussion now, although the patient thinks that this finding is chronic, she and her husband are not 100% sure of this fact and so I am going to MRI her brain to rule out acute stroke. Ultimately, she really does not want to be admitted to the hospital would like to follow up with her primary care physician this next week.  I reviewed the MRI result, normal. Discussed this with the patient and her spouse. I will go ahead and discharge home to see her primary care doctor next week.   CONSULTATIONS:   None   Patient / Family / Caregiver informed of clinical course, medical decision-making process, and agree with plan.   I discussed return precautions, follow-up instructions, and discharged instructions with patient and/or family.   ___________________________________________   FINAL CLINICAL IMPRESSION(S) / ED DIAGNOSES   Final diagnoses:  Near syncope  Hypokalemia              Note: This  dictation was prepared with Nurse, children's  dictation. Any transcriptional errors that result from this process are unintentional   Governor Rooksebecca Zeniyah Peaster, MD 07/16/15 2029

## 2015-07-16 NOTE — ED Notes (Signed)
Husband thinks lips do look different than normal.  Pt reports feels a little more numb than normal.

## 2015-07-16 NOTE — ED Notes (Signed)
Pt comes into the ED via [facility] c/o syncopal episode.  Patient was in the bathroom when she started feeling like she was going to pass out.  She was able to slide against the sink and slide to the floor before LOC.  Patient denies hitting her head or any h/o cardiac history.  CBG 79, 136/90, 70 HR, unremarkable HTN.  H/o HTN

## 2015-07-16 NOTE — ED Notes (Signed)
Dr Shaune Pollacklord at bedside to update pt

## 2015-07-16 NOTE — ED Notes (Signed)
Patient transported to CT 

## 2015-07-16 NOTE — ED Notes (Signed)
Pt. Going home with husband.  Pt. Will follow up with PCP.

## 2015-07-16 NOTE — Discharge Instructions (Signed)
You were evaluated after nearly passing out, although no certain cause was found, and your exam and evaluation are reassuring in the emergency department today.  Return to the emergency room for any worsening condition including new weakness or numbness, confusion or altered mental status, chest pain or palpitations, dizziness or passing out, coordination problems, or any other symptoms concerning to you.   Near-Syncope Near-syncope (commonly known as near fainting) is sudden weakness, dizziness, or feeling like you might pass out. This can happen when getting up or while standing for a long time. It is caused by a sudden decrease in blood flow to the brain, which can occur for various reasons. Most of the reasons are not serious.  HOME CARE Watch your condition for any changes.  Have someone stay with you until you feel stable.  If you feel like you are going to pass out:  Lie down right away.  Prop your feet up if you can.  Breathe deeply and steadily.  Move only when the feeling has gone away. Most of the time, this feeling lasts only a few minutes. You may feel tired for several hours.  Drink enough fluids to keep your pee (urine) clear or pale yellow.  If you are taking blood pressure or heart medicine, stand up slowly.  Follow up with your doctor as told. GET HELP RIGHT AWAY IF:   You have a severe headache.  You have unusual pain in the chest, belly (abdomen), or back.  You have bleeding from the mouth or butt (rectum), or you have black or tarry poop (stool).  You feel your heart beat differently than normal, or you have a very fast pulse.  You pass out, or you twitch and shake when you pass out.  You pass out when sitting or lying down.  You feel confused.  You have trouble walking.  You are weak.  You have vision problems. MAKE SURE YOU:   Understand these instructions.  Will watch your condition.  Will get help right away if you are not doing well or  get worse.   This information is not intended to replace advice given to you by your health care provider. Make sure you discuss any questions you have with your health care provider.   Document Released: 06/07/2007 Document Revised: 01/09/2014 Document Reviewed: 05/24/2012 Elsevier Interactive Patient Education 2016 [employer].  Hypokalemia Hypokalemia means that the amount of potassium in the blood is lower than normal.Potassium is a chemical, called an electrolyte, that helps regulate the amount of fluid in the body. It also stimulates muscle contraction and helps nerves function properly.Most of the body's potassium is inside of cells, and only a very small amount is in the blood. Because the amount in the blood is so small, minor changes can be life-threatening. CAUSES  Antibiotics.  Diarrhea or vomiting.  Using laxatives too much, which can cause diarrhea.  Chronic kidney disease.  Water pills (diuretics).  Eating disorders (bulimia).  Low magnesium level.  Sweating a lot. SIGNS AND SYMPTOMS  Weakness.  Constipation.  Fatigue.  Muscle cramps.  Mental confusion.  Skipped heartbeats or irregular heartbeat (palpitations).  Tingling or numbness. DIAGNOSIS  Your health care provider can diagnose hypokalemia with blood tests. In addition to checking your potassium level, your health care provider may also check other lab tests. TREATMENT Hypokalemia can be treated with potassium supplements taken by mouth or adjustments in your current medicines. If your potassium level is very low, you may need to get  potassium through a vein (IV) and be monitored in the hospital. A diet high in potassium is also helpful. Foods high in potassium are:  Nuts, such as peanuts and pistachios.  Seeds, such as sunflower seeds and pumpkin seeds.  Peas, lentils, and lima beans.  Whole grain and bran cereals and breads.  Fresh fruit and vegetables, such as apricots, avocado,  bananas, cantaloupe, kiwi, oranges, tomatoes, asparagus, and potatoes.  Orange and tomato juices.  Red meats.  Fruit yogurt. HOME CARE INSTRUCTIONS  Take all medicines as prescribed by your health care provider.  Maintain a healthy diet by including nutritious food, such as fruits, vegetables, nuts, whole grains, and lean meats.  If you are taking a laxative, be sure to follow the directions on the label. SEEK MEDICAL CARE IF:  Your weakness gets worse.  You feel your heart pounding or racing.  You are vomiting or having diarrhea.  You are diabetic and having trouble keeping your blood glucose in the normal range. SEEK IMMEDIATE MEDICAL CARE IF:  You have chest pain, shortness of breath, or dizziness.  You are vomiting or having diarrhea for more than 2 days.  You faint. MAKE SURE YOU:   Understand these instructions.  Will watch your condition.  Will get help right away if you are not doing well or get worse.   This information is not intended to replace advice given to you by your health care provider. Make sure you discuss any questions you have with your health care provider.   Document Released: 12/19/2004 Document Revised: 01/09/2014 Document Reviewed: 06/21/2012 Elsevier Interactive Patient Education [employer]2016 [employer]vier Inc.

## 2015-07-16 NOTE — ED Notes (Signed)
Patient transported to MRI 

## 2015-07-16 NOTE — Telephone Encounter (Signed)
Called in tramadol to [facility]. 

## 2015-07-20 ENCOUNTER — Encounter: Payer: Self-pay | Admitting: Family Medicine

## 2015-07-20 ENCOUNTER — Ambulatory Visit (INDEPENDENT_AMBULATORY_CARE_PROVIDER_SITE_OTHER): Payer: BLUE CROSS/BLUE SHIELD | Admitting: Family Medicine

## 2015-07-20 VITALS — BP 128/62 | HR 68 | Temp 98.3°F | Wt 188.2 lb

## 2015-07-20 DIAGNOSIS — R55 Syncope and collapse: Secondary | ICD-10-CM | POA: Diagnosis not present

## 2015-07-20 DIAGNOSIS — R5383 Other fatigue: Secondary | ICD-10-CM

## 2015-07-20 LAB — TSH: TSH: 0.88 u[IU]/mL (ref 0.35–4.50)

## 2015-07-20 LAB — VITAMIN B12: Vitamin B-12: 144 pg/mL — ABNORMAL LOW (ref 211–911)

## 2015-07-20 MED ORDER — FLUCONAZOLE 150 MG PO TABS: 150.0000 mg | ORAL_TABLET | Freq: Once | ORAL | Status: DC

## 2015-07-20 NOTE — Progress Notes (Signed)
Pre visit review using our clinic review tool, if applicable. No additional management support is needed unless otherwise documented below in the visit note.  ER f/u.  She had a witnessed event.  Prior to the event, she "didn't feel right" initially but it resolved.  Then later on, she had the feeling of diffuse weakness, she knew she couldn't hold herself up. She went down (in a controlled fashion).  She could her still hear her family talking.  No tongue biting, no incontinence.  She was still talking but her words didn't sound normal to family members.  Family helped her up.  She regained her normal function in a few minutes.  Then to ER. She had a HA.  She has h/o L lip droop at baseline, that predates all of this.  MRI wnl, CT wnl, d/w pt.  Discharged home.    No h/o SZ, no FH SZ.     She has some baseline fatigue noted.    Recently with sx of yeast vaginitis and requested diflucan rx.   Meds, vitals, and allergies reviewed.   ROS: Per HPI unless specifically indicated in ROS section   GEN: nad, alert and oriented HEENT: mucous membranes moist,  L lip droop at baseline NECK: supple w/o LA CV: rrr.  PULM: ctab, no inc wob ABD: soft, +bs EXT: no edema SKIN: no acute rash

## 2015-07-20 NOTE — Patient Instructions (Signed)
Go to the lab on the way out.  We'll contact you with your lab report. Take care.  Glad to see you.  Drink plenty of fluids in the meantime.

## 2015-07-21 ENCOUNTER — Other Ambulatory Visit: Payer: Self-pay | Admitting: Family Medicine

## 2015-07-21 DIAGNOSIS — E538 Deficiency of other specified B group vitamins: Secondary | ICD-10-CM

## 2015-07-21 DIAGNOSIS — R5383 Other fatigue: Secondary | ICD-10-CM | POA: Insufficient documentation

## 2015-07-21 DIAGNOSIS — R55 Syncope and collapse: Secondary | ICD-10-CM | POA: Insufficient documentation

## 2015-07-21 NOTE — Assessment & Plan Note (Signed)
See notes on labs.  >25 minutes spent in face to face time with patient, >50% spent in counselling or coordination of care.   Also- incidental vaginitis sx, sent diflucan rx. F/u prn.  She agrees.

## 2015-07-22 ENCOUNTER — Encounter: Payer: Self-pay | Admitting: *Deleted

## 2015-07-23 ENCOUNTER — Other Ambulatory Visit: Payer: Self-pay | Admitting: Family Medicine

## 2015-07-23 MED ORDER — "INSULIN SYRINGE 27G X 1/2"" 1 ML MISC": 1.0000 mL | Status: DC

## 2015-07-23 MED ORDER — CYANOCOBALAMIN 1000 MCG/ML IJ SOLN: 1000.0000 ug | INTRAMUSCULAR | Status: DC

## 2015-07-27 ENCOUNTER — Other Ambulatory Visit: Payer: Self-pay | Admitting: *Deleted

## 2015-07-27 ENCOUNTER — Other Ambulatory Visit: Payer: Self-pay | Admitting: Family Medicine

## 2015-07-27 DIAGNOSIS — I10 Essential (primary) hypertension: Secondary | ICD-10-CM

## 2015-07-27 MED ORDER — "SYRINGE 25G X 1"" 3 ML MISC": 0 refills | Status: AC

## 2015-09-26 ENCOUNTER — Other Ambulatory Visit: Payer: Self-pay | Admitting: Family Medicine

## 2015-09-27 NOTE — Telephone Encounter (Signed)
Electronic refill request. Last Filled:    60 tablet 1 06/20/2015  Upcoming lab and CPE scheduled in December.  Please advise.

## 2015-09-28 NOTE — Telephone Encounter (Signed)
Medication phoned to pharmacy.  

## 2015-09-28 NOTE — Telephone Encounter (Signed)
Please call in.  Thanks.   

## 2015-10-09 ENCOUNTER — Other Ambulatory Visit: Payer: Self-pay | Admitting: Family Medicine

## 2015-10-21 ENCOUNTER — Ambulatory Visit (INDEPENDENT_AMBULATORY_CARE_PROVIDER_SITE_OTHER): Payer: BLUE CROSS/BLUE SHIELD | Admitting: Orthopaedic Surgery

## 2015-10-22 ENCOUNTER — Ambulatory Visit (INDEPENDENT_AMBULATORY_CARE_PROVIDER_SITE_OTHER): Payer: Self-pay | Admitting: Orthopaedic Surgery

## 2015-10-25 ENCOUNTER — Other Ambulatory Visit (INDEPENDENT_AMBULATORY_CARE_PROVIDER_SITE_OTHER): Payer: Self-pay | Admitting: Orthopaedic Surgery

## 2015-10-25 DIAGNOSIS — M25552 Pain in left hip: Secondary | ICD-10-CM

## 2015-10-25 DIAGNOSIS — M545 Low back pain, unspecified: Secondary | ICD-10-CM

## 2015-10-25 DIAGNOSIS — M25551 Pain in right hip: Secondary | ICD-10-CM

## 2015-10-30 ENCOUNTER — Ambulatory Visit
Admission: RE | Admit: 2015-10-30 | Discharge: 2015-10-30 | Disposition: A | Discharge status: Home / Self Care | Payer: BLUE CROSS/BLUE SHIELD | Source: Ambulatory Visit | Attending: Orthopaedic Surgery | Admitting: Orthopaedic Surgery

## 2015-10-30 DIAGNOSIS — M25552 Pain in left hip: Secondary | ICD-10-CM

## 2015-10-30 DIAGNOSIS — M25551 Pain in right hip: Secondary | ICD-10-CM

## 2015-10-30 DIAGNOSIS — M545 Low back pain, unspecified: Secondary | ICD-10-CM

## 2015-11-04 ENCOUNTER — Ambulatory Visit (INDEPENDENT_AMBULATORY_CARE_PROVIDER_SITE_OTHER): Payer: BLUE CROSS/BLUE SHIELD | Admitting: Orthopedic Surgery

## 2015-11-04 ENCOUNTER — Encounter (INDEPENDENT_AMBULATORY_CARE_PROVIDER_SITE_OTHER): Payer: Self-pay | Admitting: Orthopedic Surgery

## 2015-11-04 VITALS — BP 127/84 | HR 68 | Resp 14 | Ht 66.0 in | Wt 180.0 lb

## 2015-11-04 DIAGNOSIS — M7062 Trochanteric bursitis, left hip: Secondary | ICD-10-CM | POA: Diagnosis not present

## 2015-11-04 DIAGNOSIS — M545 Low back pain: Secondary | ICD-10-CM

## 2015-11-04 DIAGNOSIS — G8929 Other chronic pain: Secondary | ICD-10-CM

## 2015-11-04 NOTE — Progress Notes (Signed)
Office Visit Note   [name]           Date of Birth: 01/03/1960           MRN: [id number] Visit Date: 11/04/2015              Requested by: Joaquim NamGraham S Duncan, MD [address] [place], [place] [id number] PCP: Crawford GivensGraham Duncan, MD   Assessment & Plan: Visit Diagnoses:  1. Chronic bilateral low back pain without sciatica   2. Trochanteric bursitis, left hip     Plan: At this time we will received with the corticosteroid injection to the left trochanteric portion of her hip which is apparently where she is pointing to at this time. This was accomplished atraumatically.  If this is beneficial should like her right one done she can make a follow-up appointment.   We have also discussed about her LS spine and certainly she may be a candidate for Dr. Alvester MorinNewton to inject the facets if she so desires. She'll make that decision later.     Follow-Up Instructions: Return if symptoms worsen or fail to improve.   Orders:  Orders Placed This Encounter  Procedures  . Ambulatory referral to Physical Medicine Rehab   No orders of the defined types were placed in this encounter.     Procedures: Large Joint Inj Date/Time: 11/05/2015 1:12 PM Performed by: Jacqualine CodePETRARCA, BRIAN D Authorized by: Jacqualine CodePETRARCA, BRIAN D   Consent Given by:  Patient Timeout: prior to procedure the correct patient, procedure, and site was verified   Indications:  Pain Location:  Hip Site:  L greater trochanter Prep: patient was prepped and draped in usual sterile fashion   Needle Size:  22 G Needle Length:  3.5 inches Approach:  Lateral Ultrasound Guidance: No   Fluoroscopic Guidance: No   Arthrogram: No Medications:  5 mL lidocaine 1 %; 80 mg methylPREDNISolone acetate 40 MG/ML; 3 mL bupivacaine 0.5 % Aspiration Attempted: No   Patient tolerance:  Patient tolerated the procedure well with no immediate complications     Clinical Data: No additional findings.   Subjective: Chief Complaint    Patient presents with  . Lower Back - Pain    [name] returns today for follow-up of multiple problems. We have performed an MRI of her lumbar spine because of pathology which was causing her leg and buttock pain. She's not doing any physical therapy or stretching or exercises. Similar multiple injections by Dr. Alvester MorinNewton for SI joint which had actually been helpful for several months but then had recurred. Locked trochanteric pain bilaterally. Denies any numbness or tingling-like pain. Denies any neurovascular compromise this time.    Review of Systems  Constitutional: Negative.   HENT: Negative.   Eyes: Negative.   Respiratory: Negative.   Cardiovascular: Negative.   Gastrointestinal: Negative.   Endocrine: Negative.   Genitourinary: Negative.   Musculoskeletal: Negative.   Skin: Negative.   Allergic/Immunologic: Negative.   Neurological: Negative.   Hematological: Negative.   Psychiatric/Behavioral: Negative.      Objective: Vital Signs: BP 127/84 (BP Location: Right Arm)   Pulse 68   Resp 14   Ht 5\' 6"  (1.676 m)   Wt 180 lb (81.6 kg)   BMI 29.05 kg/m   Physical Exam  Constitutional: She is oriented to person, place, and time. She appears well-developed and well-nourished.  HENT:  Head: Normocephalic and atraumatic.  Eyes: EOM are normal. Pupils are equal, round, and reactive to light.  Neck:  No  carotid bruits  Cardiovascular: Normal rate.   Pulmonary/Chest: Effort normal.  Neurological: She is alert and oriented to person, place, and time.  Skin: Skin is warm and dry.  Psychiatric: She has a normal mood and affect. Her behavior is normal. Judgment and thought content normal.    Right Hip Exam   Tenderness  The patient is experiencing tenderness in the greater trochanter.  Range of Motion  The patient has normal right hip ROM.  Muscle Strength  The patient has normal right hip strength.  Other  Sensation: normal Pulse: present   Left Hip Exam    Tenderness  The patient is experiencing tenderness in the greater trochanter.  Range of Motion  The patient has normal left hip ROM.  Muscle Strength  The patient has normal left hip strength.   Other  Sensation: normal Pulse: present      Specialty Comments:  No specialty comments available.  Imaging:  Mr Lumbar Spine Wo Contrast  Result Date: 10/31/2015 CLINICAL DATA:  Lumbar spine pain with bilateral hip pain EXAM: MRI LUMBAR SPINE WITHOUT CONTRAST TECHNIQUE: Multiplanar, multisequence MR imaging of the lumbar spine was performed. No intravenous contrast was administered. COMPARISON:  None. FINDINGS: Segmentation:  Normal Alignment: Mild anterior listhesis at L4-5 and L5-S1. Remaining alignment normal Vertebrae:  Negative for fracture or mass.  Normal bone marrow. Conus medullaris: Extends to the mid L2 level and appears normal. Paraspinal and other soft tissues: Paraspinous muscles are symmetric and without focal abnormality. Retroperitoneal structures normal. Disc levels: L1-2:  Negative L2-3:  Negative L3-4: Mild disc bulging and mild facet degeneration without significant stenosis L4-5: 3 mm anterior listhesis. Disc bulging and moderate facet degeneration. No significant spinal stenosis. Mild foraminal narrowing on the right without significant neural impingement. L5-S1: 3 mm anterior listhesis. Bilateral facet degeneration left greater than right. No significant canal or foraminal stenosis. IMPRESSION: Mild degenerative slip at L4-5 and L5-S1 due to facet degeneration. No significant spinal stenosis or neural impingement. Electronically Signed   By: Marlan Palau M.D.   On: 10/31/2015 07:21   PMFS History: Patient Active Problem List   Diagnosis Date Noted  . Low back pain 11/05/2015  . Trochanteric bursitis, left hip 11/05/2015  . Syncope 07/21/2015  . Fatigue 07/21/2015  . B12 deficiency 07/21/2015  . Routine general medical examination at a health care facility  11/11/2014  . Advance care planning 11/11/2014  . Rash and nonspecific skin eruption 11/11/2014  . Allergic dermatitis 11/03/2014  . Obesity 11/06/2013  . Essential hypertension 11/06/2013  . OA (osteoarthritis) 11/06/2013  . Generalized anxiety disorder 11/06/2013  . GERD (gastroesophageal reflux disease) 11/06/2013  . Lapband APS + [facility] repair August 2011 03/13/2013   Past Medical History:  Diagnosis Date  . Anxiety   . Arthritis   . Diverticulosis   . GERD (gastroesophageal reflux disease)   . Hyperlipidemia   . Hypertension   . Joint pain   . Kidney stones   . 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 Stress incontinence     Family History  Problem Relation Age of Onset  . Peripheral vascular disease Father   . GER disease Father   . Diabetes Mother   . Hypertension Mother   . Arthritis Mother   . GER disease Mother   . Cancer Mother     carcinoid tumor  . Colon cancer Maternal Uncle     possible colon cancer dx  .  Breast cancer Neg Hx     Past Surgical History:  Procedure Laterality Date  . ABDOMINAL HYSTERECTOMY    . CHOLECYSTECTOMY    . ECTOPIC PREGNANCY SURGERY    . LAPAROSCOPIC GASTRIC BANDING  08/03/2009  . ROTATOR CUFF REPAIR  05-25-11   right, with distal clavicle resection  . tmj    . TONSILLECTOMY AND ADENOIDECTOMY     Social History   Occupational History  . Not on file.   Social History Main Topics  . Smoking status: Never Smoker  . Smokeless tobacco: Never Used  . Alcohol use Yes     Comment: 2  glasses of wine per day  . Drug use: No  . Sexual activity: Not on file

## 2015-11-05 DIAGNOSIS — M7062 Trochanteric bursitis, left hip: Secondary | ICD-10-CM | POA: Diagnosis not present

## 2015-11-05 DIAGNOSIS — M545 Low back pain, unspecified: Secondary | ICD-10-CM | POA: Insufficient documentation

## 2015-11-05 MED ORDER — METHYLPREDNISOLONE ACETATE 40 MG/ML IJ SUSP
80.0000 mg | INTRAMUSCULAR | Status: AC | PRN
  Administered 2015-11-05: 80 mg

## 2015-11-05 MED ORDER — LIDOCAINE HCL 1 % IJ SOLN
5.0000 mL | INTRAMUSCULAR | Status: AC | PRN
  Administered 2015-11-05: 5 mL

## 2015-11-05 MED ORDER — BUPIVACAINE HCL 0.5 % IJ SOLN
3.0000 mL | INTRAMUSCULAR | Status: AC | PRN
  Administered 2015-11-05: 3 mL via INTRA_ARTICULAR

## 2015-11-11 ENCOUNTER — Ambulatory Visit (INDEPENDENT_AMBULATORY_CARE_PROVIDER_SITE_OTHER): Payer: BLUE CROSS/BLUE SHIELD | Admitting: Physical Medicine and Rehabilitation

## 2015-11-11 ENCOUNTER — Encounter (INDEPENDENT_AMBULATORY_CARE_PROVIDER_SITE_OTHER): Payer: Self-pay | Admitting: Physical Medicine and Rehabilitation

## 2015-11-11 VITALS — BP 126/77 | HR 63 | Temp 98.2°F

## 2015-11-11 DIAGNOSIS — M545 Low back pain, unspecified: Secondary | ICD-10-CM

## 2015-11-11 DIAGNOSIS — G8929 Other chronic pain: Secondary | ICD-10-CM

## 2015-11-11 DIAGNOSIS — M47816 Spondylosis without myelopathy or radiculopathy, lumbar region: Secondary | ICD-10-CM

## 2015-11-11 MED ORDER — METHYLPREDNISOLONE ACETATE 80 MG/ML IJ SUSP
80.0000 mg | Freq: Once | INTRAMUSCULAR | Status: AC
  Administered 2015-11-11: 80 mg

## 2015-11-11 MED ORDER — LIDOCAINE HCL (PF) 1 % IJ SOLN
0.3300 mL | Freq: Once | INTRAMUSCULAR | Status: AC
  Administered 2015-11-11: 0.3 mL

## 2015-11-11 NOTE — Progress Notes (Signed)
Tiffany Campos - 55 y.o. female MRN [id number]  Date of birth: 12/25/1960  Office Visit Note: Visit Date: 11/11/2015 PCP: Crawford GivensGraham Duncan, MD Referred by: Joaquim Namuncan, Graham S, MD  Subjective: Chief Complaint  Patient presents with  . Lower Back - Pain   HPI: Tiffany Campos is a 55 year old female who is well known to Su has chronic pain syndrome and chronic low back pain wheeze to see her more for sacroiliac joint injections. She's been having mostly axial low back pain. Having pain center of low back. Constant dull pain. Worse with standing any length of time. States her back feels weak "like it won't support me." She is seen by Arlys JohnBrian per chart. Dr. Cleophas DunkerWhitfield. They did look at an MRI that showed facet arthropathy.  Also left hip pain and burning.    ROS Otherwise per HPI.  Assessment & Plan: Visit Diagnoses:  1. Spondylosis without myelopathy or radiculopathy, lumbar region   2. Chronic bilateral low back pain without sciatica     Plan: Findings:  Plan today is diagnostic and therapeutic bilateral L4-5 and L5-S1 facet joints.    Meds & Orders:  Meds ordered this encounter  Medications  . lidocaine (PF) (XYLOCAINE) 1 % injection 0.3 mL  . methylPREDNISolone acetate (DEPO-MEDROL) injection 80 mg    Orders Placed This Encounter  Procedures  . Nerve Block    Follow-up: Return for [name], [place] as scheduled.   Procedures: No procedures performed  Lumbar Facet Joint Intra-Articular Injection(s) with Fluoroscopic Guidance  [name]      Date of Birth: 05/13/1960 MRN: [id number] PCP: Crawford GivensGraham Duncan, MD      Visit Date: 11/11/2015   Universal Protocol:    Date/Time: 11/09/172:36 PM  Consent Given By: the patient  Position: PRONE   Additional Comments: Vital signs were monitored before and after the procedure. Patient was prepped and draped in the usual sterile fashion. The correct patient, procedure, and site was verified.   Injection Procedure  Details:  Procedure Site One Meds Administered:  Meds ordered this encounter  Medications  . lidocaine (PF) (XYLOCAINE) 1 % injection 0.3 mL  . methylPREDNISolone acetate (DEPO-MEDROL) injection 80 mg     Laterality: Bilateral  Location/Site:  L4-L5 L5-S1  Needle size: 22 guage  Needle type: Spinal  Needle Placement: Articular  Findings:  -Contrast Used: 2 mL iohexol 180 mg iodine/mL   -Comments: Excellent flow of contrast producing a partial arthrogram.  Procedure Details: The fluoroscope beam is vertically oriented in AP, and the inferior recess is visualized beneath the lower pole of the inferior apophyseal process, which represents the target point for needle insertion. When direct visualization is difficult the target point is located at the medial projection of the vertebral pedicle. The region overlying each aforementioned target is locally anesthetized with a 1 to 2 ml. volume of 1% Lidocaine without Epinephrine.   The spinal needle was inserted into each of the above mentioned facet joints using biplanar fluoroscopic guidance. A 0.25 to 0.5 ml. volume of Isovue-250 was injected and a partial facet joint arthrogram was obtained. A single spot film was obtained of the resulting arthrogram.    One to 1.25 ml of the steroid/anesthetic solution was then injected into each of the facet joints noted above.   Additional Comments:  The patient tolerated the procedure well Dressing: Band-Aid and 2x2 sterile gauze     Post-procedure details: Patient was observed during the procedure. Post-procedure instructions were reviewed.  Patient left the clinic in  stable condition.     Clinical History: No specialty comments available.  She reports that she has never smoked. She has never used smokeless tobacco. No results for input(s): HGBA1C, LABURIC in the last 8760 hours.  Objective:  VS:  HT:    WT:   BMI:     BP:126/77  HR:63bpm  TEMP:98.2 F (36.8 C)( )  RESP:100  % Physical Exam  Ortho Exam Imaging: No results found.  Past Medical/Family/Surgical/Social History: Medications & Allergies reviewed per EMR Patient Active Problem List   Diagnosis Date Noted  . Low back pain 11/05/2015  . Trochanteric bursitis, left hip 11/05/2015  . Syncope 07/21/2015  . Fatigue 07/21/2015  . B12 deficiency 07/21/2015  . Routine general medical examination at a health care facility 11/11/2014  . Advance care planning 11/11/2014  . Rash and nonspecific skin eruption 11/11/2014  . Allergic dermatitis 11/03/2014  . Obesity 11/06/2013  . Essential hypertension 11/06/2013  . OA (osteoarthritis) 11/06/2013  . Generalized anxiety disorder 11/06/2013  . GERD (gastroesophageal reflux disease) 11/06/2013  . [facility] August 2011 03/13/2013   Past Medical History:  Diagnosis Date  . Anxiety   . Arthritis   . Diverticulosis   . GERD (gastroesophageal reflux disease)   . Hyperlipidemia   . Hypertension   . Joint pain   . Kidney stones   . 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 Stress incontinence    Family History  Problem Relation Age of Onset  . Peripheral vascular disease Father   . GER disease Father   . Diabetes Mother   . Hypertension Mother   . Arthritis Mother   . GER disease Mother   . Cancer Mother     carcinoid tumor  . Colon cancer Maternal Uncle     possible colon cancer dx  . Breast cancer Neg Hx    Past Surgical History:  Procedure Laterality Date  . ABDOMINAL HYSTERECTOMY    . CHOLECYSTECTOMY    . ECTOPIC PREGNANCY SURGERY    . LAPAROSCOPIC GASTRIC BANDING  08/03/2009  . ROTATOR CUFF REPAIR  05-25-11   right, with distal clavicle resection  . tmj    . TONSILLECTOMY AND ADENOIDECTOMY     Social History   Occupational History  . Not on file.   Social History Main Topics  . Smoking status: Never Smoker  . Smokeless tobacco: Never Used  . Alcohol use Yes     Comment: 2  glasses  of wine per day  . Drug use: No  . Sexual activity: Not on file

## 2015-11-11 NOTE — Patient Instructions (Signed)

## 2015-11-11 NOTE — Procedures (Signed)
Lumbar Facet Joint Intra-Articular Injection(s) with Fluoroscopic Guidance  [name]      Date of Birth: 06/15/1960 MRN: [id number] PCP: Crawford GivensGraham Duncan, MD      Visit Date: 11/11/2015   Universal Protocol:    Date/Time: 11/09/172:36 PM  Consent Given By: the patient  Position: PRONE   Additional Comments: Vital signs were monitored before and after the procedure. Patient was prepped and draped in the usual sterile fashion. The correct patient, procedure, and site was verified.   Injection Procedure Details:  Procedure Site One Meds Administered:  Meds ordered this encounter  Medications  . lidocaine (PF) (XYLOCAINE) 1 % injection 0.3 mL  . methylPREDNISolone acetate (DEPO-MEDROL) injection 80 mg     Laterality: Bilateral  Location/Site:  L4-L5 L5-S1  Needle size: 22 guage  Needle type: Spinal  Needle Placement: Articular  Findings:  -Contrast Used: 2 mL iohexol 180 mg iodine/mL   -Comments: Excellent flow of contrast producing a partial arthrogram.  Procedure Details: The fluoroscope beam is vertically oriented in AP, and the inferior recess is visualized beneath the lower pole of the inferior apophyseal process, which represents the target point for needle insertion. When direct visualization is difficult the target point is located at the medial projection of the vertebral pedicle. The region overlying each aforementioned target is locally anesthetized with a 1 to 2 ml. volume of 1% Lidocaine without Epinephrine.   The spinal needle was inserted into each of the above mentioned facet joints using biplanar fluoroscopic guidance. A 0.25 to 0.5 ml. volume of Isovue-250 was injected and a partial facet joint arthrogram was obtained. A single spot film was obtained of the resulting arthrogram.    One to 1.25 ml of the steroid/anesthetic solution was then injected into each of the facet joints noted above.   Additional Comments:  The patient tolerated  the procedure well Dressing: Band-Aid and 2x2 sterile gauze     Post-procedure details: Patient was observed during the procedure. Post-procedure instructions were reviewed.  Patient left the clinic in stable condition.

## 2015-11-12 ENCOUNTER — Other Ambulatory Visit (HOSPITAL_COMMUNITY): Payer: Self-pay | Admitting: Surgery

## 2015-11-12 DIAGNOSIS — Z9884 Bariatric surgery status: Secondary | ICD-10-CM

## 2015-11-15 ENCOUNTER — Ambulatory Visit (HOSPITAL_COMMUNITY)
Admission: RE | Admit: 2015-11-15 | Discharge: 2015-11-15 | Disposition: A | Discharge status: Home / Self Care | Payer: BLUE CROSS/BLUE SHIELD | Source: Ambulatory Visit | Attending: Surgery | Admitting: Surgery

## 2015-11-15 DIAGNOSIS — Z9884 Bariatric surgery status: Secondary | ICD-10-CM | POA: Diagnosis not present

## 2015-11-15 DIAGNOSIS — R112 Nausea with vomiting, unspecified: Secondary | ICD-10-CM | POA: Insufficient documentation

## 2015-11-15 DIAGNOSIS — K224 Dyskinesia of esophagus: Secondary | ICD-10-CM | POA: Insufficient documentation

## 2015-11-16 ENCOUNTER — Encounter: Payer: Self-pay | Admitting: Family Medicine

## 2015-11-17 ENCOUNTER — Ambulatory Visit (INDEPENDENT_AMBULATORY_CARE_PROVIDER_SITE_OTHER): Payer: BLUE CROSS/BLUE SHIELD | Admitting: Orthopedic Surgery

## 2015-12-03 ENCOUNTER — Other Ambulatory Visit: Payer: Self-pay | Admitting: Family Medicine

## 2015-12-03 NOTE — Telephone Encounter (Signed)
Electronic refill request. Last Filled:   Tramadol   30 tablet 1 07/16/2015  Last Filled:   Alprazolam  60 tablet 1 09/28/2015  Upcoming CPE:  12/13/2015  Please advise.

## 2015-12-04 NOTE — Telephone Encounter (Signed)
Please call in.  Thanks.   

## 2015-12-06 NOTE — Telephone Encounter (Signed)
Medication phoned to pharmacy.  

## 2015-12-09 ENCOUNTER — Other Ambulatory Visit: Payer: BLUE CROSS/BLUE SHIELD

## 2015-12-09 ENCOUNTER — Other Ambulatory Visit (INDEPENDENT_AMBULATORY_CARE_PROVIDER_SITE_OTHER): Payer: BLUE CROSS/BLUE SHIELD

## 2015-12-09 DIAGNOSIS — I10 Essential (primary) hypertension: Secondary | ICD-10-CM | POA: Diagnosis not present

## 2015-12-09 DIAGNOSIS — E538 Deficiency of other specified B group vitamins: Secondary | ICD-10-CM

## 2015-12-09 LAB — LIPID PANEL
CHOLESTEROL: 235 mg/dL — AB (ref 0–200)
HDL: 77.2 mg/dL (ref >39.00)
LDL Cholesterol: 144 mg/dL — ABNORMAL HIGH (ref 0–99)
NONHDL: 157.79
Total CHOL/HDL Ratio: 3
Triglycerides: 67 mg/dL (ref 0.0–149.0)
VLDL: 13.4 mg/dL (ref 0.0–40.0)

## 2015-12-09 LAB — COMPREHENSIVE METABOLIC PANEL
ALK PHOS: 89 U/L (ref 39–117)
ALT: 19 U/L (ref 0–35)
AST: 22 U/L (ref 0–37)
Albumin: 4.1 g/dL (ref 3.5–5.2)
BUN: 15 mg/dL (ref 6–23)
CALCIUM: 9.5 mg/dL (ref 8.4–10.5)
CO2: 29 mEq/L (ref 19–32)
Chloride: 107 mEq/L (ref 96–112)
Creatinine, Ser: 0.88 mg/dL (ref 0.40–1.20)
GFR: 70.45 mL/min (ref >60.00)
GLUCOSE: 90 mg/dL (ref 70–99)
POTASSIUM: 4.2 meq/L (ref 3.5–5.1)
Sodium: 143 mEq/L (ref 135–145)
TOTAL PROTEIN: 6.9 g/dL (ref 6.0–8.3)
Total Bilirubin: 0.6 mg/dL (ref 0.2–1.2)

## 2015-12-09 LAB — VITAMIN B12: VITAMIN B 12: 178 pg/mL — AB (ref 211–911)

## 2015-12-13 ENCOUNTER — Ambulatory Visit (INDEPENDENT_AMBULATORY_CARE_PROVIDER_SITE_OTHER): Payer: BLUE CROSS/BLUE SHIELD | Admitting: Family Medicine

## 2015-12-13 ENCOUNTER — Encounter: Payer: Self-pay | Admitting: Family Medicine

## 2015-12-13 VITALS — BP 106/72 | HR 52 | Temp 98.1°F | Ht 66.0 in | Wt 189.5 lb

## 2015-12-13 DIAGNOSIS — M545 Low back pain: Secondary | ICD-10-CM

## 2015-12-13 DIAGNOSIS — Z9884 Bariatric surgery status: Secondary | ICD-10-CM

## 2015-12-13 DIAGNOSIS — Z0000 Encounter for general adult medical examination without abnormal findings: Secondary | ICD-10-CM

## 2015-12-13 DIAGNOSIS — E538 Deficiency of other specified B group vitamins: Secondary | ICD-10-CM

## 2015-12-13 DIAGNOSIS — R609 Edema, unspecified: Secondary | ICD-10-CM

## 2015-12-13 DIAGNOSIS — F411 Generalized anxiety disorder: Secondary | ICD-10-CM

## 2015-12-13 DIAGNOSIS — G8929 Other chronic pain: Secondary | ICD-10-CM

## 2015-12-13 MED ORDER — CELECOXIB 200 MG PO CAPS: ORAL_CAPSULE | ORAL | 1 refills | Status: DC

## 2015-12-13 MED ORDER — HYDROCHLOROTHIAZIDE 25 MG PO TABS: ORAL_TABLET | ORAL | 3 refills | Status: DC

## 2015-12-13 MED ORDER — CYANOCOBALAMIN 1000 MCG/ML IJ SOLN: INTRAMUSCULAR | 1 refills | Status: DC

## 2015-12-13 MED ORDER — PANTOPRAZOLE SODIUM 40 MG PO TBEC: DELAYED_RELEASE_TABLET | ORAL | 3 refills | Status: DC

## 2015-12-13 NOTE — Progress Notes (Signed)
Pre visit review using our clinic review tool, if applicable. No additional management support is needed unless otherwise documented below in the visit note. 

## 2015-12-13 NOTE — Patient Instructions (Addendum)
Change the B12 to every 2 weeks and recheck level in about 3 months.  It would be reasonable to elevate the head of your bed.  Take care.  Glad to see you.

## 2015-12-13 NOTE — Progress Notes (Signed)
CPE- See plan.  Routine anticipatory guidance given to patient.  See health maintenance.  Tetanus 2011 Flu 10/01/2015 done at pharmacy.   PNA and shingles not due.  D/w pt.  Colonoscopy 2011 Pap not due, d/w pt.  DXA not due Mammogram 2017 Living will d/w pt.  Husband designated if patient were incapacitated.   Diet and exercise d/w pt.  Encouraged both. She is working on weight loss mgmt, with weight usually in the mid 180 lbs range.   Labs d/w pt.  HCV screening done prev.   Declined HIV screening prev.   She had upper GI done recently re: prev vomiting.  She is vomiting less in the meantime, she got better about the time she saw Dr. Daphine DeutscherMartin with surgery.   She still has some episodic sx, with some variable food intolerances. No blood in vomit prev.  She has routine f/u with surgery clinic.  She has prev gotten a "band holiday" but not recently.    Back pain with prev injection per outside clinic.  She has seen ortho about her B thumb pain.   H/o edema, controlled with HCTZ.  No ADE on med.   Anxiety. On BZD at baseline.  No ADE on med.  Mult stressors noted, especially her mother's chronic illnesses.    B12 def.  Level still low.  D/w pt.  Taking IM tx monthly.  See plan.    PMH and SH reviewed  Meds, vitals, and allergies reviewed.   ROS: Per HPI.  Unless specifically indicated otherwise in HPI, the patient denies:  General: fever. Eyes: acute vision changes ENT: sore throat Cardiovascular: chest pain Respiratory: SOB GI: vomiting GU: dysuria Musculoskeletal: acute back pain Derm: acute rash Neuro: acute motor dysfunction Psych: worsening mood Endocrine: polydipsia Heme: bleeding Allergy: hayfever  GEN: nad, alert and oriented HEENT: mucous membranes moist NECK: supple w/o LA CV: rrr. PULM: ctab, no inc wob ABD: soft, +bs EXT: no edema SKIN: no acute rash

## 2015-12-14 DIAGNOSIS — R609 Edema, unspecified: Secondary | ICD-10-CM | POA: Insufficient documentation

## 2015-12-14 NOTE — Assessment & Plan Note (Signed)
Controlled with hydrochlorothiazide. Labs discussed with patient. Continue as is. She agrees.

## 2015-12-14 NOTE — Assessment & Plan Note (Signed)
Previously injected. I will defer to outside clinic.

## 2015-12-14 NOTE — Assessment & Plan Note (Signed)
Tetanus 2011 Flu 10/01/2015 done at pharmacy.   PNA and shingles not due.  D/w pt.  Colonoscopy 2011 Pap not due, d/w pt.  DXA not due Mammogram 2017 Living will d/w pt.  Husband designated if patient were incapacitated.   Diet and exercise d/w pt.  Encouraged both. She is working on weight loss mgmt, with weight usually in the mid 180 lbs range.   Labs d/w pt.  HCV screening done prev.   Declined HIV screening prev.

## 2015-12-14 NOTE — Assessment & Plan Note (Signed)
I will defer to the surgery clinic. She is not having consistent vomiting at this point. Okay for outpatient follow-up.

## 2015-12-14 NOTE — Assessment & Plan Note (Signed)
Change to injection every 14 days, see after visit summary. Recheck in a few months. She agrees.

## 2015-12-14 NOTE — Assessment & Plan Note (Signed)
Continue current medications. No adverse effect on medication. Some relief with med.

## 2015-12-16 ENCOUNTER — Telehealth: Payer: Self-pay

## 2015-12-16 NOTE — Telephone Encounter (Signed)
Pt left /vm; pt was seen 12/13/15 for annual exam and forgot to mention to Dr Para Marchuncan that pt has had to have cortisone injections in spine and hip. Pt has itchy discharge and thinks cortisone may have caused it. Pt request diflucan.Please advise.[facility] [place].

## 2015-12-17 MED ORDER — FLUCONAZOLE 150 MG PO TABS: 150.0000 mg | ORAL_TABLET | Freq: Once | ORAL | 0 refills | Status: AC

## 2015-12-17 NOTE — Telephone Encounter (Signed)
Spoke to pt

## 2015-12-17 NOTE — Telephone Encounter (Signed)
Sent!

## 2016-02-10 ENCOUNTER — Other Ambulatory Visit: Payer: Self-pay | Admitting: Family Medicine

## 2016-02-11 NOTE — Telephone Encounter (Signed)
Last filled  01-10-16#60 Last OV 12-13-15 for AWV. No future OV

## 2016-02-13 NOTE — Telephone Encounter (Signed)
Please call in.  Thanks.   

## 2016-02-14 NOTE — Telephone Encounter (Signed)
Rx called to pharmacy as instructed,

## 2016-02-29 ENCOUNTER — Encounter (INDEPENDENT_AMBULATORY_CARE_PROVIDER_SITE_OTHER): Payer: Self-pay | Admitting: Orthopaedic Surgery

## 2016-02-29 ENCOUNTER — Ambulatory Visit (INDEPENDENT_AMBULATORY_CARE_PROVIDER_SITE_OTHER): Payer: BLUE CROSS/BLUE SHIELD | Admitting: Orthopaedic Surgery

## 2016-02-29 DIAGNOSIS — M1811 Unilateral primary osteoarthritis of first carpometacarpal joint, right hand: Secondary | ICD-10-CM

## 2016-02-29 DIAGNOSIS — M180 Bilateral primary osteoarthritis of first carpometacarpal joints: Secondary | ICD-10-CM

## 2016-02-29 DIAGNOSIS — M1812 Unilateral primary osteoarthritis of first carpometacarpal joint, left hand: Secondary | ICD-10-CM

## 2016-02-29 MED ORDER — BUPIVACAINE HCL 0.5 % IJ SOLN
0.3300 mL | INTRAMUSCULAR | Status: AC | PRN
  Administered 2016-02-29: .33 mL

## 2016-02-29 MED ORDER — METHYLPREDNISOLONE ACETATE 40 MG/ML IJ SUSP
13.3300 mg | INTRAMUSCULAR | Status: AC | PRN
  Administered 2016-02-29: 13.33 mg

## 2016-02-29 MED ORDER — LIDOCAINE HCL 1 % IJ SOLN
0.3000 mL | INTRAMUSCULAR | Status: AC | PRN
Start: 2016-02-29 — End: 2016-02-29
  Administered 2016-02-29: .3 mL

## 2016-02-29 MED ORDER — DICLOFENAC SODIUM 1 % TD GEL: 2.0000 g | Freq: Four times a day (QID) | TRANSDERMAL | 5 refills | Status: DC

## 2016-02-29 MED ORDER — LIDOCAINE HCL 1 % IJ SOLN
0.3000 mL | INTRAMUSCULAR | Status: AC | PRN
  Administered 2016-02-29: .3 mL

## 2016-02-29 NOTE — Progress Notes (Signed)
Office Visit Note   [name]           Date of Birth: 07/06/1960           MRN: [id number] Visit Date: 02/29/2016              Requested by: Joaquim NamGraham S Duncan, MD [address] [place], [place] [id number] PCP: Crawford GivensGraham Duncan, MD   Assessment & Plan: Visit Diagnoses:  1. Arthritis of carpometacarpal (CMC) joints of both thumbs     Plan: Bilateral thumb CMC injections were performed today under sterile conditions.Total face to face encounter time was greater than 25 minutes and over half of this time was spent in counseling and/or coordination of care.   Follow-Up Instructions: Return if symptoms worsen or fail to improve.   Orders:  No orders of the defined types were placed in this encounter.  Meds ordered this encounter  Medications  . diclofenac sodium (VOLTAREN) 1 % GEL    Sig: Apply 2 g topically 4 (four) times daily.    Dispense:  1 Tube    Refill:  5      Procedures: Hand/UE Inj Date/Time: 02/29/2016 5:51 PM Performed by: Tarry KosXU, Dreamer Carillo M Authorized by: Tarry KosXU, Devun Anna M   Consent Given by:  Patient Timeout: prior to procedure the correct patient, procedure, and site was verified   Indications:  Pain Condition: osteoarthritis   Location:  Thumb Thumb joint: CMC joint. Prep: patient was prepped and draped in usual sterile fashion   Needle Size:  25 G Approach:  Radial Medications:  0.3 mL lidocaine 1 %; 0.33 mL bupivacaine 0.5 %; 13.33 mg methylPREDNISolone acetate 40 MG/ML Hand/UE Inj Date/Time: 02/29/2016 5:51 PM Performed by: Tarry KosXU, Harim Bi M Authorized by: Tarry KosXU, Saliyah Gillin M   Consent Given by:  Patient Timeout: prior to procedure the correct patient, procedure, and site was verified   Indications:  Pain Condition: osteoarthritis   Location:  Thumb Thumb joint: CMC joint. Prep: patient was prepped and draped in usual sterile fashion   Needle Size:  25 G Medications:  0.3 mL lidocaine 1 %; 0.33 mL bupivacaine 0.5 %; 13.33 mg methylPREDNISolone  acetate 40 MG/ML     Clinical Data: No additional findings.   Subjective: Chief Complaint  Patient presents with  . Right Little Finger - Pain, Follow-up  . Left Thumb - Pain  . Right Thumb - Pain    Patient is a 56 year old female with bilateral thumb CMC arthritis that I saw last year and gave injections. She did very well from these injections she has had recent worsening would like another injection in each thumb. She denies any new numbness or tingling or new symptoms.    Review of Systems   Objective: Vital Signs: There were no vitals taken for this visit.  Physical Exam  Ortho Exam Exam of bilateral thumbs and hands are stable. Specialty Comments:  No specialty comments available.  Imaging: No results found.   PMFS History: Patient Active Problem List   Diagnosis Date Noted  . Arthritis of carpometacarpal (CMC) joints of both thumbs 02/29/2016  . Edema 12/14/2015  . Low back pain 11/05/2015  . Trochanteric bursitis, left hip 11/05/2015  . Syncope 07/21/2015  . Fatigue 07/21/2015  . B12 deficiency 07/21/2015  . Routine general medical examination at a health care facility 11/11/2014  . Advance care planning 11/11/2014  . Rash and nonspecific skin eruption 11/11/2014  . Allergic dermatitis 11/03/2014  . Obesity 11/06/2013  . Essential hypertension  11/06/2013  . OA (osteoarthritis) 11/06/2013  . Generalized anxiety disorder 11/06/2013  . GERD (gastroesophageal reflux disease) 11/06/2013  . Lapband APS + [facility] repair August 2011 03/13/2013   Past Medical History:  Diagnosis Date  . Anxiety   . Arthritis   . Diverticulosis   . GERD (gastroesophageal reflux disease)   . Hyperlipidemia   . Hypertension   . Joint pain   . Kidney stones   . 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 Stress incontinence     Family History  Problem Relation Age of Onset  . Peripheral vascular disease Father   . GER disease Father   .  Diabetes Mother   . Hypertension Mother   . Arthritis Mother   . GER disease Mother   . Cancer Mother     carcinoid tumor  . Colon cancer Maternal Uncle     possible colon cancer dx  . Breast cancer Neg Hx     Past Surgical History:  Procedure Laterality Date  . ABDOMINAL HYSTERECTOMY    . CHOLECYSTECTOMY    . ECTOPIC PREGNANCY SURGERY    . LAPAROSCOPIC GASTRIC BANDING  08/03/2009  . ROTATOR CUFF REPAIR  05-25-11   right, with distal clavicle resection  . tmj    . TONSILLECTOMY AND ADENOIDECTOMY     Social History   Occupational History  . Not on file.   Social History Main Topics  . Smoking status: Never Smoker  . Smokeless tobacco: Never Used  . Alcohol use Yes     Comment: 2  glasses of wine per day  . Drug use: No  . Sexual activity: Not on file

## 2016-03-03 ENCOUNTER — Ambulatory Visit (INDEPENDENT_AMBULATORY_CARE_PROVIDER_SITE_OTHER): Payer: BLUE CROSS/BLUE SHIELD | Admitting: Family Medicine

## 2016-03-03 ENCOUNTER — Encounter: Payer: Self-pay | Admitting: Family Medicine

## 2016-03-03 DIAGNOSIS — J40 Bronchitis, not specified as acute or chronic: Secondary | ICD-10-CM | POA: Diagnosis not present

## 2016-03-03 MED ORDER — HYDROCODONE-HOMATROPINE 5-1.5 MG/5ML PO SYRP: 5.0000 mL | ORAL_SOLUTION | Freq: Three times a day (TID) | ORAL | 0 refills | Status: DC | PRN

## 2016-03-03 NOTE — Progress Notes (Signed)
Pre-visit discussion using our clinic review tool. No additional management support is needed unless otherwise documented below in the visit note.  

## 2016-03-03 NOTE — Patient Instructions (Signed)
Likely viral bronchitis.  Use mucinex with a lot of water.   Use the cough medicine if needed.  If worse, short of breath, or if prolonged symptoms (>1 week), then let us know.   Take care.  Glad to see you.

## 2016-03-03 NOTE — Progress Notes (Signed)
Cough.  Started about 3 days ago.  Burning and irritation in the chest, then cough started.  Voice is altered, hoarse.  No sputum.  No fevers.  No vomiting, no diarrhea.  Aches, fatigued.  Wanted to get checked.  Had a flu shot.  Prev with HA, but that is resolved now.    Took a hot shower for the cough this AM.  Not yet on mucinex, not recently.    Meds, vitals, and allergies reviewed.   ROS: Per HPI unless specifically indicated in ROS section   GEN: nad, alert and oriented HEENT: mucous membranes moist, tm w/o erythema, nasal exam w/o erythema, clear discharge noted,  OP with cobblestoning NECK: supple w/ B shottly LA CV: rrr.   PULM: ctab, no inc wob, no focal dec in BS, dry cough noted.  EXT: no edema  She failed tx with tessalon prev.  D/w pt.

## 2016-03-05 DIAGNOSIS — J40 Bronchitis, not specified as acute or chronic: Secondary | ICD-10-CM | POA: Insufficient documentation

## 2016-03-05 NOTE — Assessment & Plan Note (Signed)
Likely viral bronchitis.  Use mucinex with a lot of water.   Use the cough syrup if needed. Routine cautions given. Okay for outpatient follow-up. If worse, short of breath, or if prolonged symptoms (>1 week), then let us know.

## 2016-03-27 ENCOUNTER — Telehealth (INDEPENDENT_AMBULATORY_CARE_PROVIDER_SITE_OTHER): Payer: Self-pay | Admitting: Physical Medicine and Rehabilitation

## 2016-03-27 NOTE — Telephone Encounter (Signed)
Scheduled for 04/13/16 and added to wait list.

## 2016-03-27 NOTE — Telephone Encounter (Signed)
Yes okay 

## 2016-04-13 ENCOUNTER — Ambulatory Visit (INDEPENDENT_AMBULATORY_CARE_PROVIDER_SITE_OTHER): Payer: BLUE CROSS/BLUE SHIELD

## 2016-04-13 ENCOUNTER — Encounter (INDEPENDENT_AMBULATORY_CARE_PROVIDER_SITE_OTHER): Payer: Self-pay | Admitting: Physical Medicine and Rehabilitation

## 2016-04-13 ENCOUNTER — Ambulatory Visit (INDEPENDENT_AMBULATORY_CARE_PROVIDER_SITE_OTHER): Payer: BLUE CROSS/BLUE SHIELD | Admitting: Physical Medicine and Rehabilitation

## 2016-04-13 VITALS — BP 121/68 | HR 62

## 2016-04-13 DIAGNOSIS — M47816 Spondylosis without myelopathy or radiculopathy, lumbar region: Secondary | ICD-10-CM | POA: Diagnosis not present

## 2016-04-13 MED ORDER — METHYLPREDNISOLONE ACETATE 80 MG/ML IJ SUSP
80.0000 mg | Freq: Once | INTRAMUSCULAR | Status: AC
  Administered 2016-04-13: 80 mg

## 2016-04-13 NOTE — Progress Notes (Signed)
Tiffany Campos - 56 y.o. female MRN [id number]  Date of birth: Aug 01, 1960  Office Visit Note: Visit Date: 04/13/2016 PCP: Crawford Givens, MD Referred by: Joaquim Nam, MD  Subjective: Chief Complaint  Patient presents with  . Lower Back - Pain   HPI: Tiffany Campos is a 56 year old female with chronic history of low back and pelvic pain who we last saw in November 2017 and completed bilateral facet joint blocks at L4-5 and L5-S1. She states she did really well with the last injection. Pain returned around 3 to 4 weeks ago with no new trauma or illness or weakness. Pain across lower back into hips. Mostly with standing. Thinks left side may be a little worse. Left hip is sore to the touch. Denies groin pain. Denies leg pain.    ROS Otherwise per HPI.  Assessment & Plan: Visit Diagnoses:  1. Spondylosis without myelopathy or radiculopathy, lumbar region     Plan: Findings:  Patient did extremely well in November with similar complaints and really no new findings on exam today or clinically. Within a repeat the L4-5 and L5-S1 facet joint blocks. MRI evidence, reviewed again below, does show chronic facet arthropathy at L4-5 and L5-S1 with small listhesis and no stenosis. She may end up being a good candidate for radiofrequency ablation.    Meds & Orders:  Meds ordered this encounter  Medications  . methylPREDNISolone acetate (DEPO-MEDROL) injection 80 mg    Orders Placed This Encounter  Procedures  . Facet Injection  . XR C-ARM NO REPORT    Follow-up: Return if symptoms worsen or fail to improve.   Procedures: No procedures performed  Lumbar Facet Joint Intra-Articular Injection(s) with Fluoroscopic Guidance  [name]      Date of Birth: May 21, 1960 MRN: [id number] PCP: Crawford Givens, MD      Visit Date: 04/13/2016   Universal Protocol:    Date/Time: 04/12/182:04 PM  Consent Given By: the patient  Position: PRONE   Additional Comments: Vital signs  were monitored before and after the procedure. Patient was prepped and draped in the usual sterile fashion. The correct patient, procedure, and site was verified.   Injection Procedure Details:  Procedure Site One Meds Administered:  Meds ordered this encounter  Medications  . methylPREDNISolone acetate (DEPO-MEDROL) injection 80 mg     Laterality: Bilateral  Location/Site:  L4-L5  Needle size: 22 guage  Needle type: Spinal  Needle Placement: Articular  Findings:  -Contrast Used: 1 mL iohexol 180 mg iodine/mL   -Comments: Excellent flow of contrast producing a partial arthrogram.  Procedure Details: The fluoroscope beam is vertically oriented in AP, and the inferior recess is visualized beneath the lower pole of the inferior apophyseal process, which represents the target point for needle insertion. When direct visualization is difficult the target point is located at the medial projection of the vertebral pedicle. The region overlying each aforementioned target is locally anesthetized with a 1 to 2 ml. volume of 1% Lidocaine without Epinephrine.   The spinal needle was inserted into each of the above mentioned facet joints using biplanar fluoroscopic guidance. A 0.25 to 0.5 ml. volume of Isovue-250 was injected and a partial facet joint arthrogram was obtained. A single spot film was obtained of the resulting arthrogram.    One to 1.25 ml of the steroid/anesthetic solution was then injected into each of the facet joints noted above.   Additional Comments:  The patient tolerated the procedure well Dressing: Band-Aid    Post-procedure  details: Patient was observed during the procedure. Post-procedure instructions were reviewed.  Patient left the clinic in stable condition.     Clinical History: Lumbar spine MRI dated 10/30/2015 FINDINGS: Segmentation: Normal  Alignment: Mild anterior listhesis at L4-5 and L5-S1. Remaining alignment normal  Vertebrae: Negative for  fracture or mass. Normal bone marrow.  Conus medullaris: Extends to the mid L2 level and appears normal.  Paraspinal and other soft tissues: Paraspinous muscles are symmetric and without focal abnormality. Retroperitoneal structures normal.  Disc levels:  L1-2: Negative  L2-3: Negative  L3-4: Mild disc bulging and mild facet degeneration without significant stenosis  L4-5: 3 mm anterior listhesis. Disc bulging and moderate facet degeneration. No significant spinal stenosis. Mild foraminal narrowing on the right without significant neural impingement.  L5-S1: 3 mm anterior listhesis. Bilateral facet degeneration left greater than right. No significant canal or foraminal stenosis.  IMPRESSION: Mild degenerative slip at L4-5 and L5-S1 due to facet degeneration. No significant spinal stenosis or neural impingement.  She reports that she has never smoked. She has never used smokeless tobacco. No results for input(s): HGBA1C, LABURIC in the last 8760 hours.  Objective:  VS:  HT:    WT:   BMI:     BP:121/68  HR:62bpm  TEMP: ( )  RESP:99 % Physical Exam  Musculoskeletal:  Patient ambulates without aid with good distal strength.    Ortho Exam Imaging: No results found.  Past Medical/Family/Surgical/Social History: Medications & Allergies reviewed per EMR Patient Active Problem List   Diagnosis Date Noted  . Bronchitis 03/05/2016  . Arthritis of carpometacarpal (CMC) joints of both thumbs 02/29/2016  . Edema 12/14/2015  . Low back pain 11/05/2015  . Trochanteric bursitis, left hip 11/05/2015  . Syncope 07/21/2015  . Fatigue 07/21/2015  . B12 deficiency 07/21/2015  . Routine general medical examination at a health care facility 11/11/2014  . Advance care planning 11/11/2014  . Rash and nonspecific skin eruption 11/11/2014  . Allergic dermatitis 11/03/2014  . Obesity 11/06/2013  . Essential hypertension 11/06/2013  . OA (osteoarthritis) 11/06/2013  . Generalized  anxiety disorder 11/06/2013  . GERD (gastroesophageal reflux disease) 11/06/2013  . Lapband APS + [facility] repair August 2011 03/13/2013   Past Medical History:  Diagnosis Date  . Anxiety   . Arthritis   . Diverticulosis   . GERD (gastroesophageal reflux disease)   . Hyperlipidemia   . Hypertension   . Joint pain   . Kidney stones   . 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 Stress incontinence    Family History  Problem Relation Age of Onset  . Peripheral vascular disease Father   . GER disease Father   . Diabetes Mother   . Hypertension Mother   . Arthritis Mother   . GER disease Mother   . Cancer Mother     carcinoid tumor  . Colon cancer Maternal Uncle     possible colon cancer dx  . Breast cancer Neg Hx    Past Surgical History:  Procedure Laterality Date  . ABDOMINAL HYSTERECTOMY    . CHOLECYSTECTOMY    . ECTOPIC PREGNANCY SURGERY    . LAPAROSCOPIC GASTRIC BANDING  08/03/2009  . ROTATOR CUFF REPAIR  05-25-11   right, with distal clavicle resection  . tmj    . TONSILLECTOMY AND ADENOIDECTOMY     Social History   Occupational History  . Not on file.   Social History Main Topics  .  Smoking status: Never Smoker  . Smokeless tobacco: Never Used  . Alcohol use Yes     Comment: 2  glasses of wine per day  . Drug use: No  . Sexual activity: Not on file

## 2016-04-13 NOTE — Procedures (Signed)
Lumbar Facet Joint Intra-Articular Injection(s) with Fluoroscopic Guidance  [name]      Date of Birth: August 12, 1960 MRN: [id number] PCP: Crawford Givens, MD      Visit Date: 04/13/2016   Universal Protocol:    Date/Time: 04/12/182:04 PM  Consent Given By: the patient  Position: PRONE   Additional Comments: Vital signs were monitored before and after the procedure. Patient was prepped and draped in the usual sterile fashion. The correct patient, procedure, and site was verified.   Injection Procedure Details:  Procedure Site One Meds Administered:  Meds ordered this encounter  Medications  . methylPREDNISolone acetate (DEPO-MEDROL) injection 80 mg     Laterality: Bilateral  Location/Site:  L4-L5  Needle size: 22 guage  Needle type: Spinal  Needle Placement: Articular  Findings:  -Contrast Used: 1 mL iohexol 180 mg iodine/mL   -Comments: Excellent flow of contrast producing a partial arthrogram.  Procedure Details: The fluoroscope beam is vertically oriented in AP, and the inferior recess is visualized beneath the lower pole of the inferior apophyseal process, which represents the target point for needle insertion. When direct visualization is difficult the target point is located at the medial projection of the vertebral pedicle. The region overlying each aforementioned target is locally anesthetized with a 1 to 2 ml. volume of 1% Lidocaine without Epinephrine.   The spinal needle was inserted into each of the above mentioned facet joints using biplanar fluoroscopic guidance. A 0.25 to 0.5 ml. volume of Isovue-250 was injected and a partial facet joint arthrogram was obtained. A single spot film was obtained of the resulting arthrogram.    One to 1.25 ml of the steroid/anesthetic solution was then injected into each of the facet joints noted above.   Additional Comments:  The patient tolerated the procedure well Dressing: Band-Aid    Post-procedure  details: Patient was observed during the procedure. Post-procedure instructions were reviewed.  Patient left the clinic in stable condition.

## 2016-04-13 NOTE — Patient Instructions (Signed)

## 2016-04-22 ENCOUNTER — Other Ambulatory Visit: Payer: Self-pay | Admitting: Family Medicine

## 2016-04-24 NOTE — Telephone Encounter (Signed)
Electronic refill request.  Last office visit:   12/13/2015 CPE Last Filled:   Alprazolam 60 tablet 1 02/13/2016  Last Filled:   Tramadol 30 tablet 1 12/04/2015  Please advise.

## 2016-04-25 NOTE — Telephone Encounter (Signed)
Please call in.  Thanks.   

## 2016-04-25 NOTE — Telephone Encounter (Signed)
Rx called in to requested pharmacy 

## 2016-06-05 ENCOUNTER — Other Ambulatory Visit: Payer: Self-pay | Admitting: Family Medicine

## 2016-06-05 NOTE — Telephone Encounter (Signed)
Sent. Thanks.   

## 2016-06-05 NOTE — Telephone Encounter (Signed)
Received refill request electronically Last refill 11/13/2015 #90/1 Last office visit 03/03/16 See allergy/contraindication

## 2016-06-21 ENCOUNTER — Encounter (INDEPENDENT_AMBULATORY_CARE_PROVIDER_SITE_OTHER): Payer: Self-pay | Admitting: Orthopedic Surgery

## 2016-06-21 ENCOUNTER — Ambulatory Visit (INDEPENDENT_AMBULATORY_CARE_PROVIDER_SITE_OTHER): Payer: BLUE CROSS/BLUE SHIELD | Admitting: Orthopedic Surgery

## 2016-06-21 ENCOUNTER — Ambulatory Visit (INDEPENDENT_AMBULATORY_CARE_PROVIDER_SITE_OTHER): Payer: Self-pay

## 2016-06-21 VITALS — BP 116/73 | Resp 16 | Ht 66.0 in | Wt 182.0 lb

## 2016-06-21 DIAGNOSIS — M1811 Unilateral primary osteoarthritis of first carpometacarpal joint, right hand: Secondary | ICD-10-CM | POA: Diagnosis not present

## 2016-06-21 DIAGNOSIS — M79642 Pain in left hand: Secondary | ICD-10-CM

## 2016-06-21 DIAGNOSIS — G8929 Other chronic pain: Secondary | ICD-10-CM

## 2016-06-21 DIAGNOSIS — M25512 Pain in left shoulder: Secondary | ICD-10-CM

## 2016-06-21 DIAGNOSIS — M1812 Unilateral primary osteoarthritis of first carpometacarpal joint, left hand: Secondary | ICD-10-CM

## 2016-06-21 DIAGNOSIS — M180 Bilateral primary osteoarthritis of first carpometacarpal joints: Secondary | ICD-10-CM

## 2016-06-21 MED ORDER — METHYLPREDNISOLONE ACETATE 40 MG/ML IJ SUSP
80.0000 mg | INTRAMUSCULAR | Status: AC | PRN
  Administered 2016-06-21: 80 mg

## 2016-06-21 MED ORDER — METHYLPREDNISOLONE ACETATE 40 MG/ML IJ SUSP
30.0000 mg | INTRAMUSCULAR | Status: AC | PRN
  Administered 2016-06-21: 30 mg via INTRA_ARTICULAR

## 2016-06-21 MED ORDER — BUPIVACAINE HCL 0.5 % IJ SOLN
1.0000 mL | INTRAMUSCULAR | Status: AC | PRN
  Administered 2016-06-21: 1 mL via INTRA_ARTICULAR

## 2016-06-21 MED ORDER — LIDOCAINE HCL (PF) 1 % IJ SOLN
0.5000 mL | INTRAMUSCULAR | Status: AC | PRN
  Administered 2016-06-21: .5 mL

## 2016-06-21 MED ORDER — BUPIVACAINE HCL 0.5 % IJ SOLN
2.0000 mL | INTRAMUSCULAR | Status: AC | PRN
  Administered 2016-06-21: 2 mL via INTRA_ARTICULAR

## 2016-06-21 MED ORDER — LIDOCAINE HCL 1 % IJ SOLN
2.0000 mL | INTRAMUSCULAR | Status: AC | PRN
  Administered 2016-06-21: 2 mL

## 2016-06-21 NOTE — Progress Notes (Signed)
Office Visit Note   [name]           Date of Birth: 06/24/1960           MRN: [id number] Visit Date: 06/21/2016              Requested by: Joaquim Namuncan, Graham S, MD [address] [place], [place] [id number] PCP: Joaquim Namuncan, Graham S, MD   Assessment & Plan: Visit Diagnoses:  1. Chronic left shoulder pain   2. Pain of left hand   3. Arthritis of carpometacarpal (CMC) joints of both thumbs     Plan:  #1: [facility]oid [facility] the left [facility] #2: Corticosteroid injection subacromial to the left shoulder #3: Given a thumb spica brace for her left hand #4: Given CMC bracing bilaterally. #5: I spent 90 minutes with this patient of which over 50% was for counseling and benefits as well as the injections.  Follow-Up Instructions: Return if symptoms worsen or fail to improve.   Orders:  Orders Placed This Encounter  Procedures  . Large Joint Injection/Arthrocentesis  . Medium Joint Injection/Arthrocentesis  . XR Wrist Complete Left  . XR Shoulder Left   No orders of the defined types were placed in this encounter.     Procedures: Large Joint Inj Date/Time: 06/21/2016 1:11 PM Performed by: Jacqualine CodePETRARCA, Paula Busenbark D Authorized by: Jacqualine CodePETRARCA, Santana Gosdin D   Consent Given by:  Patient Timeout: prior to procedure the correct patient, procedure, and site was verified   Indications:  Pain Location:  Shoulder Site:  L subacromial bursa Prep: patient was prepped and draped in usual sterile fashion   Needle Size:  25 G Needle Length:  1.5 inches Approach:  Anterolateral Ultrasound Guidance: No   Fluoroscopic Guidance: No   Arthrogram: No   Medications:  80 mg methylPREDNISolone acetate 40 MG/ML; 2 mL lidocaine 1 %; 2 mL bupivacaine 0.5 % Aspiration Attempted: No   Patient tolerance:  Patient tolerated the procedure well with no immediate complications Medium Joint Inj Date/Time: 06/21/2016 1:17 PM Performed by: Jacqualine CodePETRARCA, Chester Romero D Authorized by: Jacqualine CodePETRARCA,  Sanaii Caporaso D   Consent Given by:  Patient Site marked: the procedure site was marked   Timeout: prior to procedure the correct patient, procedure, and site was verified   Indications:  Pain Location:  Wrist Site:  L intercarpal Prep: patient was prepped and draped in usual sterile fashion   Needle Size:  27 G Needle Length:  1.5 inches Approach:  Anterolateral Ultrasound Guided: No   Fluoroscopic Guidance: No   Medications:  0.5 mL lidocaine (PF) 1 %; 1 mL bupivacaine 0.5 %; 30 mg methylPREDNISolone acetate 40 MG/ML Aspiration Attempted: No       Clinical Data: No additional findings.   Subjective: Chief Complaint  Patient presents with  . Left Shoulder - Pain  . Right Shoulder - Pain  . Left Hand - Pain  . Right Hand - Pain  . Shoulder Pain    Bil shoulder pain, Left worse than right, Left shoulder pain x 1 year, 2013, no injury, no surgery to left shoulder, surgery right shoulder Dr. Cleophas [facility], limited range of motion, Celebrex helps, difficulty sleeping  . Hand Pain    Bil hand pain, right hand pain x 3 years, Left worse than right, left hand pain x 3 months, weakness and burning in left hand - thumb and wrist area, dropping things, burning self due to hand weakness, no surgery, no injury, not diabetic    Tiffany Campos  is a 56 year old white female who is seen today for multiple problems. She states she has bilateral shoulder pain left greater than right for at least the past year. She also had the surgery on the right shoulder in 2013 which involved that of arthrofibrosis requiring a manipulation as well as a diagnostic arthroscopy of the right shoulder with debridement of the rotator cuff partial tear. She had a subacromial decompression and distal clavicle excision. She states she still has some pain and discomfort in those areas of the right shoulder but her left shoulder is much worse.  She also complains of bilateral hand pain mainly in the wrist area. She apparently seen  Dr. Roda Shutters recently and had injections in February and both first [facility] joints. He had suggested that of a sling procedure for her left hand but she has refused to have that done. She complains especially the right hand for about 3 years and on the left for about 3 months. She says weakness and burning in the left hand and thumb area as well as the wrist area. She complains of dropping things and burning herself due to hand weakness. Eyes any history of injury or trauma. She denies being a diabetic. Seen today for evaluation.        Review of Systems  Constitutional: Negative.   HENT: Negative.   Respiratory: Negative.   Cardiovascular: Negative.   Gastrointestinal: Negative.   Genitourinary: Negative.   Skin: Negative.   Neurological: Negative.   Hematological: Negative.   Psychiatric/Behavioral: Negative.      Objective: Vital Signs: BP 116/73 (BP Location: Left Arm, Patient Position: Sitting, Cuff Size: Normal)   Resp 16   Ht 5\' 6"  (1.676 m)   Wt 182 lb (82.6 kg)   BMI 29.38 kg/m   Physical Exam  Constitutional: She is oriented to person, place, and time. She appears well-developed and well-nourished.  HENT:  Head: Normocephalic and atraumatic.  Eyes: EOM are normal. Pupils are equal, round, and reactive to light.  Pulmonary/Chest: Effort normal.  Neurological: She is alert and oriented to person, place, and time.  Skin: Skin is warm and dry.  Psychiatric: She has a normal mood and affect. Her behavior is normal. Judgment and thought content normal.    Ortho Exam  Right shoulder exam reveals near full range of motion of the right shoulder. Negative apprehension. Negative impingement sign. Good strength throughout.  Left shoulder reveals only 150 of forward flexion 120 of abduction at best. With the arm at 90 of the abduction she has around 80 of external rotation only 45 internal rotation. Positive decant testing in both abduction and forward flexion is noted. She has  breakaway weakness in all planes tested. And is in the subacromial area as well as in the somewhat less in the acromioclavicular joint  The left wrist reveals obtained pain and tenderness at the first [facility] joint. She has a positive grind test. Neurovascular intact distally.  Specialty Comments:  No specialty comments available.  Imaging: Xr Wrist Complete Left  Result Date: 06/21/2016 4 view x-ray of the left wrist reveals first [facility] joint space narrowing. There is also some spurring at the distal scaphoid. Subluxation of the first [facility] joint is noted somewhere around 20%.  Xr Shoulder Left  Result Date: 06/21/2016 4 view x-ray of the shoulder reveals type II acromion. She has inferior spurring at the glenohumeral joint but maintaining a very good joint space. Marked joint space narrowing of the before meals joint with superior distal  clavicle spur noted.    PMFS History: Patient Active Problem List   Diagnosis Date Noted  . Bronchitis 03/05/2016  . Arthritis of carpometacarpal (CMC) joints of both thumbs 02/29/2016  . Edema 12/14/2015  . Low back pain 11/05/2015  . Trochanteric bursitis, left hip 11/05/2015  . Syncope 07/21/2015  . Fatigue 07/21/2015  . B12 deficiency 07/21/2015  . Routine general medical examination at a health care facility 11/11/2014  . Advance care planning 11/11/2014  . Rash and nonspecific skin eruption 11/11/2014  . Allergic dermatitis 11/03/2014  . Obesity 11/06/2013  . Essential hypertension 11/06/2013  . OA (osteoarthritis) 11/06/2013  . Generalized anxiety disorder 11/06/2013  . GERD (gastroesophageal reflux disease) 11/06/2013  . Lapband APS + [facility] repair August 2011 03/13/2013   Past Medical History:  Diagnosis Date  . Anxiety   . Arthritis   . Diverticulosis   . GERD (gastroesophageal reflux disease)   . Hyperlipidemia   . Hypertension   . Joint pain   . Kidney stones   . 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 Stress incontinence     Family History  Problem Relation Age of Onset  . Peripheral vascular disease Father   . GER disease Father   . Diabetes Mother   . Hypertension Mother   . Arthritis Mother   . GER disease Mother   . Cancer Mother        carcinoid tumor  . Colon cancer Maternal Uncle        possible colon cancer dx  . Breast cancer Neg Hx     Past Surgical History:  Procedure Laterality Date  . ABDOMINAL HYSTERECTOMY    . CHOLECYSTECTOMY    . ECTOPIC PREGNANCY SURGERY    . LAPAROSCOPIC GASTRIC BANDING  08/03/2009  . ROTATOR CUFF REPAIR  05-25-11   right, with distal clavicle resection  . SHOULDER ARTHROSCOPY    . tmj    . TONSILLECTOMY AND ADENOIDECTOMY     Social History   Occupational History  . Not on file.   Social History Main Topics  . Smoking status: Never Smoker  . Smokeless tobacco: Never Used  . Alcohol use 8.4 oz/week    14 Glasses of wine per week  . Drug use: No  . Sexual activity: Not on file

## 2016-07-03 ENCOUNTER — Telehealth (INDEPENDENT_AMBULATORY_CARE_PROVIDER_SITE_OTHER): Payer: Self-pay | Admitting: Orthopedic Surgery

## 2016-07-03 NOTE — Telephone Encounter (Signed)
Patient would like to discuss injection in lt thumb, with Tiffany Campos that she got two weeks ago. If you would, please call her back to discuss at your earliest convenience.

## 2016-07-04 NOTE — Telephone Encounter (Signed)
Please advise 

## 2016-07-15 ENCOUNTER — Other Ambulatory Visit: Payer: Self-pay | Admitting: Family Medicine

## 2016-07-17 NOTE — Telephone Encounter (Signed)
Last filled 05-23-16 #60 Last CPE 12-18-15 No Future OV

## 2016-07-17 NOTE — Telephone Encounter (Signed)
Please call in.  Thanks.   

## 2016-07-17 NOTE — Telephone Encounter (Signed)
Rx called to pharmacy as instructed. 

## 2016-07-27 ENCOUNTER — Telehealth (INDEPENDENT_AMBULATORY_CARE_PROVIDER_SITE_OTHER): Payer: Self-pay

## 2016-07-31 NOTE — Telephone Encounter (Signed)
appt made for Si joint

## 2016-08-01 ENCOUNTER — Telehealth (INDEPENDENT_AMBULATORY_CARE_PROVIDER_SITE_OTHER): Payer: Self-pay

## 2016-08-01 ENCOUNTER — Telehealth (INDEPENDENT_AMBULATORY_CARE_PROVIDER_SITE_OTHER): Payer: Self-pay | Admitting: Radiology

## 2016-08-01 MED ORDER — PREDNISONE 50 MG PO TABS: ORAL_TABLET | ORAL | 0 refills | Status: DC

## 2016-08-01 NOTE — Telephone Encounter (Signed)
ok 

## 2016-08-01 NOTE — Telephone Encounter (Signed)
Patient would like to schedule another injection for her Si joint. Had bil Si done on 03/25/15. Says she is only having problems on the left side now.

## 2016-08-01 NOTE — Telephone Encounter (Signed)
I called and advised pt rx was sent in

## 2016-08-01 NOTE — Telephone Encounter (Signed)
Prednisone 50mg  daily x 5 days sent to pharmacy on record, does not need to be tapered

## 2016-08-01 NOTE — Telephone Encounter (Signed)
Pt scheduled for 08/09/16 

## 2016-08-01 NOTE — Telephone Encounter (Signed)
Patient called and lmom that she was wondering if Dr. Alvester MorinNewton would think a Pred pak would help her.  She states that can't get an appointment before she goes out of town tomorrow.

## 2016-08-09 ENCOUNTER — Ambulatory Visit (INDEPENDENT_AMBULATORY_CARE_PROVIDER_SITE_OTHER): Payer: BLUE CROSS/BLUE SHIELD | Admitting: Physical Medicine and Rehabilitation

## 2016-09-01 ENCOUNTER — Other Ambulatory Visit: Payer: Self-pay | Admitting: Family Medicine

## 2016-09-01 NOTE — Telephone Encounter (Signed)
Electronic refill request. Tramadol Last office visit:   03/03/16 Last Filled:    30 tablet 1 04/25/2016  Please advise.

## 2016-09-04 NOTE — Telephone Encounter (Signed)
Please call in.  Thanks.   

## 2016-09-05 ENCOUNTER — Other Ambulatory Visit: Payer: Self-pay | Admitting: Family Medicine

## 2016-09-05 NOTE — Telephone Encounter (Signed)
Rx called to pharmacy as instructed. 

## 2016-09-05 NOTE — Telephone Encounter (Signed)
Last office visit 03/03/2016 for bronchitis.  Last refilled 12/13/2015 for 10 ml with 1 refill.  Last Vitamin B12 level 12/09/2015-low at 178 pg/ml.  Ok to refill?

## 2016-09-06 NOTE — Telephone Encounter (Signed)
Needs f/u B12 level done, prior to an injection to get a trough level.  rx sent.  Thanks.

## 2016-09-06 NOTE — Telephone Encounter (Signed)
Patient notified as instructed by telephone and verbalized understanding.  Patient stated that she just took her B-12 injection and will call back prior to her taking her next one to get a lab appointment scheduled.

## 2016-09-06 NOTE — Telephone Encounter (Signed)
Left message on voicemail for patient to call back. 

## 2016-09-12 ENCOUNTER — Encounter: Payer: Self-pay | Admitting: Family Medicine

## 2016-09-20 ENCOUNTER — Other Ambulatory Visit (INDEPENDENT_AMBULATORY_CARE_PROVIDER_SITE_OTHER): Payer: BLUE CROSS/BLUE SHIELD

## 2016-09-20 DIAGNOSIS — E538 Deficiency of other specified B group vitamins: Secondary | ICD-10-CM

## 2016-09-20 LAB — VITAMIN B12: Vitamin B-12: 554 pg/mL (ref 211–911)

## 2016-09-22 ENCOUNTER — Encounter: Payer: Self-pay | Admitting: *Deleted

## 2016-09-30 ENCOUNTER — Other Ambulatory Visit: Payer: Self-pay | Admitting: Family Medicine

## 2016-10-02 NOTE — Telephone Encounter (Signed)
She should have a refill  

## 2016-10-02 NOTE — Telephone Encounter (Signed)
Received refill electronically Last refill 06/05/16 #90/1 Last office visit 03/03/16

## 2016-10-03 ENCOUNTER — Other Ambulatory Visit: Payer: Self-pay

## 2016-10-03 NOTE — Telephone Encounter (Signed)
Pt request refill celebrex to [facility] [place]; I spoke with [place] at [facility] and they did not get additional refill on 06/05/16; I gave the refill verbally to dolly and pt voiced understanding.

## 2016-10-03 NOTE — Telephone Encounter (Signed)
Pt request refill alprazolam to [facility] [place]. Last refilled # 60 x 1 on 07/17/16. Last annual 12/13/15. Pt does not need cb when refilled.

## 2016-10-04 MED ORDER — ALPRAZOLAM 1 MG PO TABS: ORAL_TABLET | ORAL | 1 refills | Status: DC

## 2016-10-04 NOTE — Telephone Encounter (Signed)
Alprazolam called into [id number] [place], [place] - [address] Phone: [phone] 

## 2016-10-04 NOTE — Telephone Encounter (Signed)
Please call in.  Thanks.   

## 2016-10-28 ENCOUNTER — Other Ambulatory Visit: Payer: Self-pay | Admitting: Family Medicine

## 2016-10-30 NOTE — Telephone Encounter (Signed)
Last refill 09/04/16 #30/1 Last office visit 03/03/16

## 2016-10-31 NOTE — Telephone Encounter (Signed)
Please call in.  Thanks.   

## 2016-10-31 NOTE — Telephone Encounter (Signed)
Medication phoned to pharmacy.  

## 2016-11-26 ENCOUNTER — Other Ambulatory Visit: Payer: Self-pay | Admitting: Family Medicine

## 2016-11-28 ENCOUNTER — Other Ambulatory Visit: Payer: Self-pay | Admitting: Family Medicine

## 2016-12-01 LAB — HM MAMMOGRAPHY

## 2016-12-05 ENCOUNTER — Encounter: Payer: Self-pay | Admitting: Family Medicine

## 2016-12-08 ENCOUNTER — Ambulatory Visit (INDEPENDENT_AMBULATORY_CARE_PROVIDER_SITE_OTHER): Payer: BLUE CROSS/BLUE SHIELD | Admitting: Orthopaedic Surgery

## 2016-12-11 ENCOUNTER — Other Ambulatory Visit: Payer: BLUE CROSS/BLUE SHIELD

## 2016-12-12 ENCOUNTER — Other Ambulatory Visit: Payer: Self-pay | Admitting: Family Medicine

## 2016-12-12 DIAGNOSIS — E538 Deficiency of other specified B group vitamins: Secondary | ICD-10-CM

## 2016-12-12 DIAGNOSIS — E785 Hyperlipidemia, unspecified: Secondary | ICD-10-CM

## 2016-12-13 ENCOUNTER — Other Ambulatory Visit (INDEPENDENT_AMBULATORY_CARE_PROVIDER_SITE_OTHER): Payer: BLUE CROSS/BLUE SHIELD

## 2016-12-13 DIAGNOSIS — E538 Deficiency of other specified B group vitamins: Secondary | ICD-10-CM

## 2016-12-13 DIAGNOSIS — E785 Hyperlipidemia, unspecified: Secondary | ICD-10-CM | POA: Diagnosis not present

## 2016-12-13 LAB — COMPREHENSIVE METABOLIC PANEL
ALK PHOS: 83 U/L (ref 39–117)
ALT: 17 U/L (ref 0–35)
AST: 23 U/L (ref 0–37)
Albumin: 4.2 g/dL (ref 3.5–5.2)
BUN: 13 mg/dL (ref 6–23)
CO2: 30 meq/L (ref 19–32)
Calcium: 9.3 mg/dL (ref 8.4–10.5)
Chloride: 104 mEq/L (ref 96–112)
Creatinine, Ser: 0.78 mg/dL (ref 0.40–1.20)
GFR: 80.69 mL/min (ref >60.00)
GLUCOSE: 89 mg/dL (ref 70–99)
POTASSIUM: 4.6 meq/L (ref 3.5–5.1)
Sodium: 140 mEq/L (ref 135–145)
Total Bilirubin: 0.8 mg/dL (ref 0.2–1.2)
Total Protein: 7.1 g/dL (ref 6.0–8.3)

## 2016-12-13 LAB — LIPID PANEL
CHOL/HDL RATIO: 3
Cholesterol: 203 mg/dL — ABNORMAL HIGH (ref 0–200)
HDL: 68.9 mg/dL (ref >39.00)
LDL CALC: 122 mg/dL — AB (ref 0–99)
NONHDL: 133.96
Triglycerides: 59 mg/dL (ref 0.0–149.0)
VLDL: 11.8 mg/dL (ref 0.0–40.0)

## 2016-12-13 LAB — VITAMIN B12: Vitamin B-12: >1500 pg/mL — ABNORMAL HIGH (ref 211–911)

## 2016-12-14 ENCOUNTER — Encounter: Payer: Self-pay | Admitting: Family Medicine

## 2016-12-14 ENCOUNTER — Ambulatory Visit (INDEPENDENT_AMBULATORY_CARE_PROVIDER_SITE_OTHER): Payer: BLUE CROSS/BLUE SHIELD | Admitting: Family Medicine

## 2016-12-14 VITALS — BP 142/80 | HR 73 | Temp 97.8°F | Ht 66.0 in | Wt 183.5 lb

## 2016-12-14 DIAGNOSIS — Z9884 Bariatric surgery status: Secondary | ICD-10-CM

## 2016-12-14 DIAGNOSIS — R609 Edema, unspecified: Secondary | ICD-10-CM

## 2016-12-14 DIAGNOSIS — M158 Other polyosteoarthritis: Secondary | ICD-10-CM

## 2016-12-14 DIAGNOSIS — Z0000 Encounter for general adult medical examination without abnormal findings: Secondary | ICD-10-CM | POA: Diagnosis not present

## 2016-12-14 DIAGNOSIS — F411 Generalized anxiety disorder: Secondary | ICD-10-CM

## 2016-12-14 DIAGNOSIS — E538 Deficiency of other specified B group vitamins: Secondary | ICD-10-CM

## 2016-12-14 MED ORDER — ALPRAZOLAM 1 MG PO TABS: ORAL_TABLET | ORAL | 1 refills | Status: DC

## 2016-12-14 MED ORDER — DULOXETINE HCL 20 MG PO CPEP: 20.0000 mg | ORAL_CAPSULE | Freq: Every day | ORAL | 3 refills | Status: DC

## 2016-12-14 MED ORDER — CELECOXIB 200 MG PO CAPS: ORAL_CAPSULE | ORAL | 1 refills | Status: DC

## 2016-12-14 NOTE — Progress Notes (Signed)
CPE- See plan.  Routine anticipatory guidance given to patient.  See health maintenance.  The possibility exists that previously documented standard health maintenance information may have been brought forward from a previous encounter into this note.  If needed, that same information has been updated to reflect the current situation based on today's encounter.    Tetanus 2011 Flu 2018 PNA and shingles not due. D/w pt.  Colonoscopy 2011.   Pap not due, d/w pt.   DXA not due  Mammogram 2018 Living will d/w pt. Husband designated if patient were incapacitated.  Diet and exercise d/w pt. Encouraged both. She is working on weight loss mgmt, with weight usually in the mid 180 lbs range.   Labs d/w pt.  HCV screening done prev.   Declined HIV screening prev.   H/o lab band surgery noted. Labs d/w pt. She has some occ abd pain and diarrhea with what she describes as maladaptive eating patterns, with intolerance of some cereals- d/w pt.  No blood in stool.  D/w pt about f/u with gen surgery clinic, encouraged routine f/u.    Back pain with prev injection per outside clinic.  She has seen ortho prev. Still on celebrex at baseline and no ADE on med.  Her shoulder and L hip pain continues.  She has diffuse aches at baseline. Some days worse recently when she had chills.  Temp 101 at the time.  There was a recently illness in the community with fever, aches and chills, which would match her sx.  Her sx resolved in the meantime- no more fevers.  She takes tramadol rarely.    H/o edema, controlled with HCTZ daily.  No ADE on med.   Anxiety. On BZD at baseline.  No ADE on med.  Mult stressors noted, especially her mother's chronic illnesses.  Her husband is also sick with an abnormal heart valve and has sig CAD.  He has planned CABG and valve replacement.  D/w pt about cymbalta trial since she didn't tolerate lexapro.  No SI/HI.   B12 def.  Level high, she had dose just before lab draw so this is a  peak level.  D/w pt.  Taking IM tx 2x monthly.  Would continue as is, d/w pt.    PMH and SH reviewed  Meds, vitals, and allergies reviewed.   ROS: Per HPI.  Unless specifically indicated otherwise in HPI, the patient denies:  General: fever. Eyes: acute vision changes ENT: sore throat Cardiovascular: chest pain Respiratory: SOB GI: vomiting GU: dysuria Musculoskeletal: acute back pain Derm: acute rash Neuro: acute motor dysfunction Psych: worsening mood Endocrine: polydipsia Heme: bleeding Allergy: hayfever  GEN: nad, alert and oriented, talkative, requires redirection at baseline.   HEENT: mucous membranes moist NECK: supple w/o LA CV: rrr. PULM: ctab, no inc wob ABD: soft, +bs EXT: no edema SKIN: no acute rash

## 2016-12-14 NOTE — Patient Instructions (Signed)
Start cymbalta 20mg  a day and update me if that doesn't help or if you can't tolerate it.  Take care.  Glad to see you.

## 2016-12-17 NOTE — Assessment & Plan Note (Signed)
H/o edema, controlled with HCTZ daily.  No ADE on med.

## 2016-12-17 NOTE — Assessment & Plan Note (Signed)
Okay for outpatient f/u.  D/w pt about cymbalta trial since she didn't tolerate lexapro.  No SI/HI.  See AVS.  She'll update me after med start.

## 2016-12-17 NOTE — Assessment & Plan Note (Signed)
Level high, she had dose just before lab draw so this is a peak level.  D/w pt.  Taking IM tx 2x monthly.  Would continue as is, d/w pt.

## 2016-12-17 NOTE — Assessment & Plan Note (Signed)
Tetanus 2011 Flu 2018 PNA and shingles not due. D/w pt.  Colonoscopy 2011.   Pap not due, d/w pt.   DXA not due  Mammogram 2018 Living will d/w pt. Husband designated if patient were incapacitated.  Diet and exercise d/w pt. Encouraged both. She is working on weight loss mgmt, with weight usually in the mid 180 lbs range.   Labs d/w pt.  HCV screening done prev.   Declined HIV screening prev.

## 2016-12-17 NOTE — Assessment & Plan Note (Signed)
Continue baseline medication but add on Cymbalta as this may help her pain some.  Some of her more recent pain symptoms were with a concurrent fever and she likely had a benign viral process in addition to her baseline osteoarthritic changes. D/w pt.

## 2016-12-17 NOTE — Assessment & Plan Note (Signed)
Per surgery clinic. 

## 2016-12-18 ENCOUNTER — Other Ambulatory Visit: Payer: Self-pay

## 2016-12-18 ENCOUNTER — Emergency Department: Payer: BLUE CROSS/BLUE SHIELD

## 2016-12-18 ENCOUNTER — Inpatient Hospital Stay
Admission: EM | Admit: 2016-12-18 | Discharge: 2016-12-20 | DRG: 854 | Disposition: A | Discharge status: Home / Self Care | Payer: BLUE CROSS/BLUE SHIELD | Attending: Internal Medicine | Admitting: Internal Medicine

## 2016-12-18 ENCOUNTER — Inpatient Hospital Stay: Payer: BLUE CROSS/BLUE SHIELD | Admitting: Anesthesiology

## 2016-12-18 ENCOUNTER — Ambulatory Visit: Payer: Self-pay | Admitting: *Deleted

## 2016-12-18 ENCOUNTER — Ambulatory Visit: Payer: BLUE CROSS/BLUE SHIELD | Admitting: Family Medicine

## 2016-12-18 ENCOUNTER — Encounter
Admission: EM | Disposition: A | Discharge status: Home / Self Care | Payer: Self-pay | Source: Home / Self Care | Attending: Internal Medicine

## 2016-12-18 ENCOUNTER — Encounter: Payer: Self-pay | Admitting: Emergency Medicine

## 2016-12-18 ENCOUNTER — Encounter: Payer: Self-pay | Admitting: Family Medicine

## 2016-12-18 VITALS — BP 110/70 | HR 111 | Temp 103.1°F | Resp 17 | Ht 66.0 in | Wt 177.5 lb

## 2016-12-18 DIAGNOSIS — Z833 Family history of diabetes mellitus: Secondary | ICD-10-CM | POA: Diagnosis not present

## 2016-12-18 DIAGNOSIS — K219 Gastro-esophageal reflux disease without esophagitis: Secondary | ICD-10-CM | POA: Diagnosis present

## 2016-12-18 DIAGNOSIS — Z79899 Other long term (current) drug therapy: Secondary | ICD-10-CM

## 2016-12-18 DIAGNOSIS — Z800 Family history of malignant neoplasm of digestive organs: Secondary | ICD-10-CM | POA: Diagnosis not present

## 2016-12-18 DIAGNOSIS — R509 Fever, unspecified: Secondary | ICD-10-CM

## 2016-12-18 DIAGNOSIS — Z888 Allergy status to other drugs, medicaments and biological substances status: Secondary | ICD-10-CM | POA: Diagnosis not present

## 2016-12-18 DIAGNOSIS — N132 Hydronephrosis with renal and ureteral calculous obstruction: Secondary | ICD-10-CM | POA: Diagnosis not present

## 2016-12-18 DIAGNOSIS — E538 Deficiency of other specified B group vitamins: Secondary | ICD-10-CM | POA: Diagnosis present

## 2016-12-18 DIAGNOSIS — Z9049 Acquired absence of other specified parts of digestive tract: Secondary | ICD-10-CM

## 2016-12-18 DIAGNOSIS — Z87442 Personal history of urinary calculi: Secondary | ICD-10-CM | POA: Diagnosis not present

## 2016-12-18 DIAGNOSIS — R740 Nonspecific elevation of levels of transaminase and lactic acid dehydrogenase [LDH]: Secondary | ICD-10-CM | POA: Diagnosis present

## 2016-12-18 DIAGNOSIS — R6889 Other general symptoms and signs: Secondary | ICD-10-CM | POA: Diagnosis not present

## 2016-12-18 DIAGNOSIS — E876 Hypokalemia: Secondary | ICD-10-CM | POA: Diagnosis present

## 2016-12-18 DIAGNOSIS — E785 Hyperlipidemia, unspecified: Secondary | ICD-10-CM | POA: Diagnosis present

## 2016-12-18 DIAGNOSIS — N12 Tubulo-interstitial nephritis, not specified as acute or chronic: Secondary | ICD-10-CM

## 2016-12-18 DIAGNOSIS — I10 Essential (primary) hypertension: Secondary | ICD-10-CM | POA: Diagnosis present

## 2016-12-18 DIAGNOSIS — Z9884 Bariatric surgery status: Secondary | ICD-10-CM

## 2016-12-18 DIAGNOSIS — N393 Stress incontinence (female) (male): Secondary | ICD-10-CM | POA: Diagnosis present

## 2016-12-18 DIAGNOSIS — M545 Low back pain, unspecified: Secondary | ICD-10-CM

## 2016-12-18 DIAGNOSIS — Z8249 Family history of ischemic heart disease and other diseases of the circulatory system: Secondary | ICD-10-CM | POA: Diagnosis not present

## 2016-12-18 DIAGNOSIS — R109 Unspecified abdominal pain: Secondary | ICD-10-CM

## 2016-12-18 DIAGNOSIS — M1990 Unspecified osteoarthritis, unspecified site: Secondary | ICD-10-CM | POA: Diagnosis present

## 2016-12-18 DIAGNOSIS — N200 Calculus of kidney: Secondary | ICD-10-CM | POA: Diagnosis not present

## 2016-12-18 DIAGNOSIS — Z90710 Acquired absence of both cervix and uterus: Secondary | ICD-10-CM

## 2016-12-18 DIAGNOSIS — Z8261 Family history of arthritis: Secondary | ICD-10-CM | POA: Diagnosis not present

## 2016-12-18 DIAGNOSIS — A419 Sepsis, unspecified organism: Principal | ICD-10-CM

## 2016-12-18 DIAGNOSIS — F411 Generalized anxiety disorder: Secondary | ICD-10-CM | POA: Diagnosis present

## 2016-12-18 DIAGNOSIS — N136 Pyonephrosis: Secondary | ICD-10-CM | POA: Diagnosis present

## 2016-12-18 HISTORY — PX: CYSTOSCOPY W/ URETERAL STENT PLACEMENT: SHX1429

## 2016-12-18 LAB — CBC WITH DIFFERENTIAL/PLATELET
BASOS ABS: 0 10*3/uL (ref 0–0.1)
BASOS PCT: 0 %
EOS PCT: 0 %
Eosinophils Absolute: 0 10*3/uL (ref 0–0.7)
HCT: 41.6 % (ref 35.0–47.0)
Hemoglobin: 14.1 g/dL (ref 12.0–16.0)
Lymphocytes Relative: 3 %
Lymphs Abs: 0.5 10*3/uL — ABNORMAL LOW (ref 1.0–3.6)
MCH: 31.1 pg (ref 26.0–34.0)
MCHC: 33.9 g/dL (ref 32.0–36.0)
MCV: 91.7 fL (ref 80.0–100.0)
MONO ABS: 2.4 10*3/uL — AB (ref 0.2–0.9)
Monocytes Relative: 15 %
Neutro Abs: 13.3 10*3/uL — ABNORMAL HIGH (ref 1.4–6.5)
Neutrophils Relative %: 82 %
PLATELETS: 185 10*3/uL (ref 150–440)
RBC: 4.54 MIL/uL (ref 3.80–5.20)
RDW: 12.4 % (ref 11.5–14.5)
WBC: 16.2 10*3/uL — ABNORMAL HIGH (ref 3.6–11.0)

## 2016-12-18 LAB — URINALYSIS, COMPLETE (UACMP) WITH MICROSCOPIC
BILIRUBIN URINE: NEGATIVE
Glucose, UA: NEGATIVE mg/dL
Ketones, ur: NEGATIVE mg/dL
Nitrite: NEGATIVE
PH: 6 (ref 5.0–8.0)
Protein, ur: 30 mg/dL — AB
SPECIFIC GRAVITY, URINE: 1.008 (ref 1.005–1.030)
SQUAMOUS EPITHELIAL / LPF: NONE SEEN

## 2016-12-18 LAB — COMPREHENSIVE METABOLIC PANEL
ALBUMIN: 3.4 g/dL — AB (ref 3.5–5.0)
ALK PHOS: 224 U/L — AB (ref 38–126)
ALT: 180 U/L — AB (ref 14–54)
AST: 105 U/L — AB (ref 15–41)
Anion gap: 11 (ref 5–15)
BILIRUBIN TOTAL: 1.6 mg/dL — AB (ref 0.3–1.2)
BUN: 19 mg/dL (ref 6–20)
CALCIUM: 8.8 mg/dL — AB (ref 8.9–10.3)
CO2: 25 mmol/L (ref 22–32)
Chloride: 96 mmol/L — ABNORMAL LOW (ref 101–111)
Creatinine, Ser: 0.85 mg/dL (ref 0.44–1.00)
GFR calc Af Amer: >60 mL/min (ref >60)
GFR calc non Af Amer: >60 mL/min (ref >60)
GLUCOSE: 125 mg/dL — AB (ref 65–99)
Potassium: 2.7 mmol/L — CL (ref 3.5–5.1)
Sodium: 132 mmol/L — ABNORMAL LOW (ref 135–145)
TOTAL PROTEIN: 7.1 g/dL (ref 6.5–8.1)

## 2016-12-18 LAB — POCT URINALYSIS DIP (MANUAL ENTRY)
Glucose, UA: NEGATIVE mg/dL
Nitrite, UA: POSITIVE — AB
PH UA: 5.5 (ref 5.0–8.0)
SPEC GRAV UA: 1.02 (ref 1.010–1.025)

## 2016-12-18 LAB — POC INFLUENZA A&B (BINAX/QUICKVUE)
INFLUENZA A, POC: NEGATIVE
Influenza B, POC: NEGATIVE

## 2016-12-18 LAB — LACTIC ACID, PLASMA: Lactic Acid, Venous: 0.9 mmol/L (ref 0.5–1.9)

## 2016-12-18 LAB — POCT RAPID STREP A (OFFICE): RAPID STREP A SCREEN: NEGATIVE

## 2016-12-18 SURGERY — CYSTOSCOPY, WITH RETROGRADE PYELOGRAM AND URETERAL STENT INSERTION
Anesthesia: General | Laterality: Left | Wound class: Contaminated

## 2016-12-18 MED ORDER — FENTANYL CITRATE (PF) 100 MCG/2ML IJ SOLN
25.0000 ug | INTRAMUSCULAR | Status: DC | PRN
  Administered 2016-12-18 (×2): 25 ug via INTRAVENOUS

## 2016-12-18 MED ORDER — ONDANSETRON HCL 4 MG/2ML IJ SOLN: 4.0000 mg | Freq: Four times a day (QID) | INTRAMUSCULAR | Status: DC | PRN

## 2016-12-18 MED ORDER — PROPOFOL 10 MG/ML IV BOLUS
INTRAVENOUS | Status: DC | PRN
  Administered 2016-12-18: 40 mg via INTRAVENOUS
  Administered 2016-12-18: 150 mg via INTRAVENOUS

## 2016-12-18 MED ORDER — ONDANSETRON HCL 4 MG/2ML IJ SOLN
INTRAMUSCULAR | Status: AC
  Filled 2016-12-18: qty 2

## 2016-12-18 MED ORDER — ONDANSETRON HCL 4 MG/2ML IJ SOLN: 4.0000 mg | Freq: Once | INTRAMUSCULAR | Status: DC | PRN

## 2016-12-18 MED ORDER — ACETAMINOPHEN 325 MG PO TABS: 650.0000 mg | ORAL_TABLET | Freq: Once | ORAL | Status: DC

## 2016-12-18 MED ORDER — ONDANSETRON HCL 4 MG/2ML IJ SOLN
4.0000 mg | Freq: Once | INTRAMUSCULAR | Status: AC
  Administered 2016-12-18: 4 mg via INTRAVENOUS
  Filled 2016-12-18: qty 2

## 2016-12-18 MED ORDER — DEXAMETHASONE SODIUM PHOSPHATE 10 MG/ML IJ SOLN
INTRAMUSCULAR | Status: AC
  Filled 2016-12-18: qty 1

## 2016-12-18 MED ORDER — PROPOFOL 10 MG/ML IV BOLUS
INTRAVENOUS | Status: AC
  Filled 2016-12-18: qty 20

## 2016-12-18 MED ORDER — ROCURONIUM BROMIDE 100 MG/10ML IV SOLN
INTRAVENOUS | Status: DC | PRN
  Administered 2016-12-18: 40 mg via INTRAVENOUS

## 2016-12-18 MED ORDER — IBUPROFEN 400 MG PO TABS
400.0000 mg | ORAL_TABLET | ORAL | Status: AC
  Administered 2016-12-18: 400 mg via ORAL
  Filled 2016-12-18: qty 1

## 2016-12-18 MED ORDER — SODIUM CHLORIDE 0.9 % IV BOLUS (SEPSIS)
1500.0000 mL | Freq: Once | INTRAVENOUS | Status: AC
  Administered 2016-12-18: 1500 mL via INTRAVENOUS

## 2016-12-18 MED ORDER — SUCCINYLCHOLINE CHLORIDE 20 MG/ML IJ SOLN
INTRAMUSCULAR | Status: AC
  Filled 2016-12-18: qty 1

## 2016-12-18 MED ORDER — ONDANSETRON HCL 4 MG/2ML IJ SOLN
INTRAMUSCULAR | Status: DC | PRN
  Administered 2016-12-18: 4 mg via INTRAVENOUS

## 2016-12-18 MED ORDER — LACTATED RINGERS IV SOLN
INTRAVENOUS | Status: DC | PRN
  Administered 2016-12-18: 21:00:00 via INTRAVENOUS

## 2016-12-18 MED ORDER — POTASSIUM CHLORIDE 10 MEQ/100ML IV SOLN
10.0000 meq | INTRAVENOUS | Status: AC
  Administered 2016-12-18 (×2): 10 meq via INTRAVENOUS
  Filled 2016-12-18 (×3): qty 100

## 2016-12-18 MED ORDER — ACETAMINOPHEN 650 MG RE SUPP: 650.0000 mg | Freq: Four times a day (QID) | RECTAL | Status: DC | PRN

## 2016-12-18 MED ORDER — POLYETHYLENE GLYCOL 3350 17 G PO PACK: 17.0000 g | PACK | Freq: Every day | ORAL | Status: DC | PRN

## 2016-12-18 MED ORDER — CIPROFLOXACIN IN D5W 400 MG/200ML IV SOLN
400.0000 mg | Freq: Once | INTRAVENOUS | Status: AC
  Administered 2016-12-18: 400 mg via INTRAVENOUS
  Filled 2016-12-18: qty 200

## 2016-12-18 MED ORDER — ROCURONIUM BROMIDE 50 MG/5ML IV SOLN
INTRAVENOUS | Status: AC
  Filled 2016-12-18: qty 1

## 2016-12-18 MED ORDER — ACETAMINOPHEN 325 MG PO TABS
650.0000 mg | ORAL_TABLET | Freq: Four times a day (QID) | ORAL | Status: DC | PRN
  Administered 2016-12-19 – 2016-12-20 (×2): 650 mg via ORAL
  Filled 2016-12-18 (×2): qty 2

## 2016-12-18 MED ORDER — HEPARIN SODIUM (PORCINE) 5000 UNIT/ML IJ SOLN
5000.0000 [IU] | Freq: Three times a day (TID) | INTRAMUSCULAR | Status: DC
  Administered 2016-12-19 – 2016-12-20 (×4): 5000 [IU] via SUBCUTANEOUS
  Filled 2016-12-18 (×4): qty 1

## 2016-12-18 MED ORDER — HYDROCODONE-ACETAMINOPHEN 5-325 MG PO TABS: 1.0000 | ORAL_TABLET | ORAL | Status: DC | PRN

## 2016-12-18 MED ORDER — MIDAZOLAM HCL 2 MG/2ML IJ SOLN
INTRAMUSCULAR | Status: AC
Start: 2016-12-18 — End: 2016-12-18
  Filled 2016-12-18: qty 2

## 2016-12-18 MED ORDER — POTASSIUM CHLORIDE 10 MEQ/100ML IV SOLN
10.0000 meq | Freq: Once | INTRAVENOUS | Status: AC
  Administered 2016-12-19: 10 meq via INTRAVENOUS
  Filled 2016-12-18: qty 100

## 2016-12-18 MED ORDER — FENTANYL CITRATE (PF) 100 MCG/2ML IJ SOLN
INTRAMUSCULAR | Status: AC
Start: 2016-12-18 — End: 2016-12-18
  Administered 2016-12-18: 25 ug via INTRAVENOUS
  Filled 2016-12-18: qty 2

## 2016-12-18 MED ORDER — DEXTROSE 5 % IV SOLN: 1.0000 g | Freq: Two times a day (BID) | INTRAVENOUS | Status: DC

## 2016-12-18 MED ORDER — FENTANYL CITRATE (PF) 100 MCG/2ML IJ SOLN
INTRAMUSCULAR | Status: AC
  Filled 2016-12-18: qty 2

## 2016-12-18 MED ORDER — DEXTROSE 5 % IV SOLN
1.0000 g | INTRAVENOUS | Status: DC
  Administered 2016-12-19 (×2): 1 g via INTRAVENOUS
  Filled 2016-12-18 (×3): qty 10

## 2016-12-18 MED ORDER — SUCCINYLCHOLINE CHLORIDE 20 MG/ML IJ SOLN
INTRAMUSCULAR | Status: DC | PRN
  Administered 2016-12-18: 100 mg via INTRAVENOUS

## 2016-12-18 MED ORDER — FENTANYL CITRATE (PF) 100 MCG/2ML IJ SOLN
INTRAMUSCULAR | Status: DC | PRN
  Administered 2016-12-18: 100 ug via INTRAVENOUS

## 2016-12-18 MED ORDER — ONDANSETRON HCL 4 MG PO TABS: 4.0000 mg | ORAL_TABLET | Freq: Four times a day (QID) | ORAL | Status: DC | PRN

## 2016-12-18 MED ORDER — DEXTROSE 5 % IV SOLN
2.0000 g | Freq: Once | INTRAVENOUS | Status: AC
  Administered 2016-12-18: 2 g via INTRAVENOUS
  Filled 2016-12-18: qty 2

## 2016-12-18 MED ORDER — POTASSIUM CHLORIDE IN NACL 20-0.9 MEQ/L-% IV SOLN
INTRAVENOUS | Status: DC
  Administered 2016-12-19 (×2): via INTRAVENOUS
  Filled 2016-12-18 (×7): qty 1000

## 2016-12-18 MED ORDER — DEXAMETHASONE SODIUM PHOSPHATE 10 MG/ML IJ SOLN
INTRAMUSCULAR | Status: DC | PRN
  Administered 2016-12-18: 10 mg via INTRAVENOUS

## 2016-12-18 MED ORDER — CEFTRIAXONE SODIUM IN DEXTROSE 20 MG/ML IV SOLN: 1.0000 g | INTRAVENOUS | Status: DC

## 2016-12-18 MED ORDER — SUGAMMADEX SODIUM 200 MG/2ML IV SOLN
INTRAVENOUS | Status: DC | PRN
  Administered 2016-12-18: 157 mg via INTRAVENOUS

## 2016-12-18 MED ORDER — MIDAZOLAM HCL 2 MG/2ML IJ SOLN
INTRAMUSCULAR | Status: DC | PRN
  Administered 2016-12-18: 2 mg via INTRAVENOUS

## 2016-12-18 SURGICAL SUPPLY — 2 items
CATH URETL 5X70 OPEN END (CATHETERS) ×2 IMPLANT
STENT SFT UR W/WIRE 6FRX26CM (STENTS) ×2 IMPLANT

## 2016-12-18 NOTE — ED Notes (Signed)
PT went to CT  

## 2016-12-18 NOTE — ED Notes (Signed)
Code Sepsis called 1700

## 2016-12-18 NOTE — Progress Notes (Signed)
CODE SEPSIS - PHARMACY COMMUNICATION  **Broad Spectrum Antibiotics should be administered within 1 hour of Sepsis diagnosis**  Time Code Sepsis Called/Page Received: 1703   Antibiotics Ordered: cefepime  Time of 1st antibiotic administration: 1720   Cindi CarbonMary M Paticia Moster ,PharmD, BCPS Clinical Pharmacist  12/18/2016  5:58 PM

## 2016-12-18 NOTE — Op Note (Signed)
Preoperative diagnosis: Sepsis, left ureteral stone, left hydronephrosis  Postoperative diagnosis: Same  Procedure: Cystoscopy with left retrograde pyelogram and left ureteral stent placement  Surgeon: Mena GoesEskridge  Anesthesia: General  Indication for procedure: 56 year old with progressive fevers and flank pain over the past 4 days.  Temperature was 103 in the emergency department.  CT scan revealed a 5 mm left distal stone with mild proximal hydroureteronephrosis and perinephric edema and stranding.  She was brought for urgent stent.  Findings: On cystoscopy the urethra and the bladder are unremarkable.  There was no stone or foreign body in the bladder.  Clear reflux from the right was noted.  Left retrograde pyelogram-this outlined a single ureter single collecting system unit with a filling defect in the right distal ureter in the mid pelvis consistent with the stone and mild dilation proximally.  The fluoroscopy was flipped such that the patient's left side was on the left side of the screen when facing it.  No one knew how to reverse this.  Description of procedure: After consent was obtained the patient brought to the operating room.  After adequate anesthesia she is placed in lithotomy position and prepped and draped in usual sterile fashion.  A timeout was performed to confirm the patient and procedure.  The cystoscope was passed per urethra and the bladder inspected.  The left ureteral orifice was cannulated with a 5 [place] open-ended catheter and left retrograde injection of contrast was performed.  A sensor wire was then it advanced past the stone and a 6 x 26 cm stent was passed and deployed.  The wire was removed with the stent coil in the upper calyx on the left and a good coil in the bladder.  Stent appeared to be draining appropriately.  She was awakened and taken to the recovery room in stable condition.  Complications: None Blood loss: Minimal Specimens: None  Drains: 6 x 26 cm  left ureteral stent

## 2016-12-18 NOTE — Transfer of Care (Signed)
Immediate Anesthesia Transfer of Care Note  [name]  Procedure(s) Performed: CYSTOSCOPY WITH RETROGRADE PYELOGRAM/URETERAL STENT PLACEMENT (Left )  Patient Location: PACU  Anesthesia Type:General  Level of Consciousness: sedated  Airway & Oxygen Therapy: Patient Spontanous Breathing and Patient connected to face mask oxygen  Post-op Assessment: Report given to RN and Post -op Vital signs reviewed and stable  Post vital signs: Reviewed and stable  Last Vitals:  Vitals:   12/18/16 1618 12/18/16 1700  BP: 106/64 115/68  Pulse: (!) 102 88  Resp: (!) 8 15  Temp: (!) 39.5 C   SpO2: 94% 93%    Last Pain:  Vitals:   12/18/16 1657  TempSrc:   PainSc: 7          Complications: No apparent anesthesia complications

## 2016-12-18 NOTE — Progress Notes (Signed)
Pharmacy Antibiotic Note  Tiffany Campos is a 56 y.o. female admitted on 12/18/2016 with UTI.  Pharmacy has been consulted for cefepime dosing.  Plan: Cefepime 1 g IV q12h  Height: 5\' 6"  (167.6 cm) Weight: 173 lb (78.5 kg) IBW/kg (Calculated) : 59.3  Temp (24hrs), Avg:103.1 F (39.5 C), Min:103.1 F (39.5 C), Max:103.1 F (39.5 C)  Recent Labs  Lab 12/13/16 1057 12/18/16 1711  WBC  --  16.2*  CREATININE 0.78 0.85  LATICACIDVEN  --  0.9    Estimated Creatinine Clearance: 77.2 mL/min (by C-G formula based on SCr of 0.85 mg/dL).    Allergies  Allergen Reactions  . Lexapro [Escitalopram Oxalate] Other (See Comments)    Worsening mood  . Lodine [Etodolac] Other (See Comments)    GERD- tolerates celebrex  . Naproxen Other (See Comments)    GERD- tolerates celebrex    Antimicrobials this admission: cefepime 12/17 >>   Dose adjustments this admission:  Microbiology results: 12/17 BCx: Sent 12/17 UCx: Sent   Thank you for allowing pharmacy to be a part of this patient's care.  Cindi CarbonMary M Kraig Genis, PharmD, BCPS Clinical Pharmacist 12/18/2016 7:18 PM

## 2016-12-18 NOTE — Anesthesia Post-op Follow-up Note (Signed)
Anesthesia QCDR form completed.        

## 2016-12-18 NOTE — Anesthesia Preprocedure Evaluation (Signed)
Anesthesia Evaluation  Patient identified by MRN, date of birth, ID band Patient awake    Reviewed: Allergy & Precautions, H&P , NPO status , Patient's Chart, lab work & pertinent test results, reviewed documented beta blocker date and time   Airway Mallampati: II  TM Distance: >3 FB Neck ROM: full    Dental  (+) Teeth Intact   Pulmonary neg pulmonary ROS,    Pulmonary exam normal        Cardiovascular Exercise Tolerance: Good hypertension, On Medications negative cardio ROS Normal cardiovascular exam Rate:Normal     Neuro/Psych PSYCHIATRIC DISORDERS negative neurological ROS  negative psych ROS   GI/Hepatic negative GI ROS, Neg liver ROS, GERD  Medicated,  Endo/Other  negative endocrine ROS  Renal/GU Renal diseasenegative Renal ROS  negative genitourinary   Musculoskeletal   Abdominal   Peds  Hematology negative hematology ROS (+)   Anesthesia Other Findings   Reproductive/Obstetrics negative OB ROS                             Anesthesia Physical Anesthesia Plan  ASA: II and emergent  Anesthesia Plan: General LMA   Post-op Pain Management:    Induction:   PONV Risk Score and Plan: 4 or greater  Airway Management Planned:   Additional Equipment:   Intra-op Plan:   Post-operative Plan:   Informed Consent: I have reviewed the patients History and Physical, chart, labs and discussed the procedure including the risks, benefits and alternatives for the proposed anesthesia with the patient or authorized representative who has indicated his/her understanding and acceptance.     Plan Discussed with: CRNA  Anesthesia Plan Comments:         Anesthesia Quick Evaluation

## 2016-12-18 NOTE — ED Triage Notes (Signed)
Fever and body aches x 4 days, headache and L flank and back pain. States has mild urinary discomfort, states went to urgent care and told her she had urinary tract infection

## 2016-12-18 NOTE — ED Triage Notes (Signed)
States was given tylenol at PMD just prior to arrival.

## 2016-12-18 NOTE — Consult Note (Signed)
Consult: left ureteral stone, sepsis Requested by: Dr. Sharyn CreamerMark Quale  History of Present Illness: 56 yo WF with fever, chills and left flank pain for four days but worse today. WBC 16, cr 0.85, UA rare bacteria, 6-30 wbc and rbc. Blood and urine cx pending. No dysuria. Pt was febrile to 103 on ED. CT with 5 mm left distal stone and proximal hydronephrosis with left renal edema and PN stranding.    This is not her first stone but she has never needed surgery for a stone.   Past Medical History:  Diagnosis Date  . Anxiety   . Arthritis   . Diverticulosis   . GERD (gastroesophageal reflux disease)   . Hyperlipidemia   . Hypertension   . Joint pain   . Kidney stones   . 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 Stress incontinence    Past Surgical History:  Procedure Laterality Date  . ABDOMINAL HYSTERECTOMY    . CHOLECYSTECTOMY    . ECTOPIC PREGNANCY SURGERY    . LAPAROSCOPIC GASTRIC BANDING  08/03/2009  . ROTATOR CUFF REPAIR  05-25-11   right, with distal clavicle resection  . SHOULDER ARTHROSCOPY    . tmj    . TONSILLECTOMY AND ADENOIDECTOMY      Home Medications:   (Not in a hospital admission) Allergies:  Allergies  Allergen Reactions  . Lexapro [Escitalopram Oxalate] Other (See Comments)    Worsening mood  . Lodine [Etodolac] Other (See Comments)    GERD- tolerates celebrex  . Naproxen Other (See Comments)    GERD- tolerates celebrex    Family History  Problem Relation Age of Onset  . Peripheral vascular disease Father   . GER disease Father   . Diabetes Mother   . Hypertension Mother   . Arthritis Mother   . GER disease Mother   . Cancer Mother        carcinoid tumor  . Colon cancer Maternal Uncle        possible colon cancer dx  . Breast cancer Neg Hx    Social History:  reports that  has never smoked. she has never used smokeless tobacco. She reports that she drinks about 8.4 oz of alcohol per week. She reports that she does  not use drugs.  ROS: A complete review of systems was performed.  All systems are negative except for pertinent findings as noted. ROS   Physical Exam:  Vital signs in last 24 hours: Temp:  [103.1 F (39.5 C)] 103.1 F (39.5 C) (12/17 1618) Pulse Rate:  [88-111] 88 (12/17 1700) Resp:  [8-17] 15 (12/17 1700) BP: (106-115)/(64-70) 115/68 (12/17 1700) SpO2:  [93 %-95 %] 93 % (12/17 1700) Weight:  [78.5 kg (173 lb)-80.5 kg (177 lb 8 oz)] 78.5 kg (173 lb) (12/17 1619) General:  Alert and oriented, No acute distress HEENT: Normocephalic, atraumatic Neck: No JVD or lymphadenopathy Cardiovascular: Regular rate and rhythm Lungs: Regular rate and effort Abdomen: Soft, nontender, nondistended, no abdominal masses Back: No CVA tenderness Extremities: No edema Neurologic: Grossly intact  Laboratory Data:  Results for orders placed or performed during the hospital encounter of 12/18/16 (from the past 24 hour(s))  Lactic acid, plasma     Status: None   Collection Time: 12/18/16  5:11 PM  Result Value Ref Range   Lactic Acid, Venous 0.9 0.5 - 1.9 mmol/L  Comprehensive metabolic panel     Status: Abnormal   Collection Time: 12/18/16  5:11 PM  Result Value Ref Range   Sodium 132 (L) 135 - 145 mmol/L   Potassium 2.7 (LL) 3.5 - 5.1 mmol/L   Chloride 96 (L) 101 - 111 mmol/L   CO2 25 22 - 32 mmol/L   Glucose, Bld 125 (H) 65 - 99 mg/dL   BUN 19 6 - 20 mg/dL   Creatinine, Ser 1.610.85 0.44 - 1.00 mg/dL   Calcium 8.8 (L) 8.9 - 10.3 mg/dL   Total Protein 7.1 6.5 - 8.1 g/dL   Albumin 3.4 (L) 3.5 - 5.0 g/dL   AST 096105 (H) 15 - 41 U/L   ALT 180 (H) 14 - 54 U/L   Alkaline Phosphatase 224 (H) 38 - 126 U/L   Total Bilirubin 1.6 (H) 0.3 - 1.2 mg/dL   GFR calc non Af Amer >60 >60 mL/min   GFR calc Af Amer >60 >60 mL/min   Anion gap 11 5 - 15  CBC with Differential     Status: Abnormal   Collection Time: 12/18/16  5:11 PM  Result Value Ref Range   WBC 16.2 (H) 3.6 - 11.0 K/uL   RBC 4.54 3.80 - 5.20  MIL/uL   Hemoglobin 14.1 12.0 - 16.0 g/dL   HCT 04.541.6 40.935.0 - 81.147.0 %   MCV 91.7 80.0 - 100.0 fL   MCH 31.1 26.0 - 34.0 pg   MCHC 33.9 32.0 - 36.0 g/dL   RDW 91.412.4 78.211.5 - 95.614.5 %   Platelets 185 150 - 440 K/uL   Neutrophils Relative % 82 %   Neutro Abs 13.3 (H) 1.4 - 6.5 K/uL   Lymphocytes Relative 3 %   Lymphs Abs 0.5 (L) 1.0 - 3.6 K/uL   Monocytes Relative 15 %   Monocytes Absolute 2.4 (H) 0.2 - 0.9 K/uL   Eosinophils Relative 0 %   Eosinophils Absolute 0.0 0 - 0.7 K/uL   Basophils Relative 0 %   Basophils Absolute 0.0 0 - 0.1 K/uL  Urinalysis, Complete w Microscopic     Status: Abnormal   Collection Time: 12/18/16  5:11 PM  Result Value Ref Range   Color, Urine YELLOW (A) YELLOW   APPearance CLEAR (A) CLEAR   Specific Gravity, Urine 1.008 1.005 - 1.030   pH 6.0 5.0 - 8.0   Glucose, UA NEGATIVE NEGATIVE mg/dL   Hgb urine dipstick MODERATE (A) NEGATIVE   Bilirubin Urine NEGATIVE NEGATIVE   Ketones, ur NEGATIVE NEGATIVE mg/dL   Protein, ur 30 (A) NEGATIVE mg/dL   Nitrite NEGATIVE NEGATIVE   Leukocytes, UA SMALL (A) NEGATIVE   RBC / HPF 6-30 0 - 5 RBC/hpf   WBC, UA 6-30 0 - 5 WBC/hpf   Bacteria, UA RARE (A) NONE SEEN   Squamous Epithelial / LPF NONE SEEN NONE SEEN   Amorphous Crystal PRESENT    No results found for this or any previous visit (from the past 240 hour(s)). Creatinine: Recent Labs    12/13/16 1057 12/18/16 1711  CREATININE 0.78 0.85    Impression/Assessment/plan: I discussed with the patient the nature, potential benefits, risks and alternatives to cystoscopy, left RGP, left ureteral stent, including side effects of the proposed treatment, the likelihood of the patient achieving the goals of the procedure, and any potential problems that might occur during the procedure or recuperation. We discussed the rationale for a staged procedure and not proceeding with URS tonight but in a few weeks. All questions answered. Patient elects to proceed.    Jerilee FieldMatthew  Suhaib Guzzo 12/18/2016, 7:51 PM

## 2016-12-18 NOTE — Discharge Instructions (Signed)
Ureteral Stent Implantation, Care After Refer to this sheet in the next few weeks. These instructions provide you with information about caring for yourself after your procedure. Your health care provider may also give you more specific instructions. Your treatment has been planned according to current medical practices, but problems sometimes occur. Call your health care provider if you have any problems or questions after your procedure.  Removal of the stent-be sure to [facility] to plan for removal of the stent/stone.  What can I expect after the procedure? After the procedure, it is common to have:  Nausea.  Mild pain when you urinate. You may feel this pain in your lower back or lower abdomen. Pain should stop within a few minutes after you urinate. This may last for up to 1 week.  A small amount of blood in your urine for several days.  Follow these instructions at home:  Medicines  Take over-the-counter and prescription medicines only as told by your health care provider.  If you were prescribed an antibiotic medicine, take it as told by your health care provider. Do not stop taking the antibiotic even if you start to feel better.  Do not drive for 24 hours if you received a sedative.  Do not drive or operate heavy machinery while taking prescription pain medicines. Activity  Return to your normal activities as told by your health care provider. Ask your health care provider what activities are safe for you.  Do not lift anything that is heavier than 10 lb (4.5 kg). Follow this limit for 1 week after your procedure, or for as long as told by your health care provider. General instructions  Watch for any blood in your urine. Call your health care provider if the amount of blood in your urine increases.  If you have a catheter: ? Follow instructions from your health care provider about taking care of your catheter and collection bag. ? Do  not take baths, swim, or use a hot tub until your health care provider approves.  Drink enough fluid to keep your urine clear or pale yellow.  Keep all follow-up visits as told by your health care provider. This is important. Contact a health care provider if:  You have pain that gets worse or does not get better with medicine, especially pain when you urinate.  You have difficulty urinating.  You feel nauseous or you vomit repeatedly during a period of more than 2 days after the procedure. Get help right away if:  Your urine is dark red or has blood clots in it.  You are leaking urine (have incontinence).  The end of the stent comes out of your urethra.  You cannot urinate.  You have sudden, sharp, or severe pain in your abdomen or lower back.  You have a fever. This information is not intended to replace advice given to you by your health care provider. Make sure you discuss any questions you have with your health care provider. Document Released: 08/21/2012 Document Revised: 05/27/2015 Document Reviewed: 07/03/2014 Elsevier Interactive Patient Education  [employer]2018 [employer]

## 2016-12-18 NOTE — Progress Notes (Signed)
Chart reviewed, CT images reviewed -- discussed with Dr. Fanny BienQuale - left kidney swollen and PN stranding, too. She needs an urgent stent. Full consult to follow.

## 2016-12-18 NOTE — Telephone Encounter (Signed)
Pt has   Been  Sick  X   4  Days   With   Fever  Body  Aches     Chills   And  Back  Pain.  Decreased  Appetite  Taking  Fluids  And  Tylenol    For  The  Symptoms .  Seen   5  Days ago    For  Physical . No  Availability  [facility]   [facility]  [facility]  [facility] .    Reason for Disposition . Fever present > 3 days (72 hours)  Answer Assessment - Initial Assessment Questions 1. TEMPERATURE: "What is the most recent temperature?"  "How was it measured?"102.7 2. ONSET: "When did the fever start?"       4  Days  ago 3. SYMPTOMS: "Do you have any other symptoms besides the fever?"  (e.g., colds, headache, sore throat, earache, cough, rash, diarrhea, vomiting, abdominal pain)   Back  Pain  Pain   Body  Aches    Some nausea   Dry  Heaves    4. CAUSE: If there are no symptoms, ask: "What do you think is causing the fever?"       No  Known  5. CONTACTS: "Does anyone else in the family have an infection?"       no 6. TREATMENT: "What have you done so far to treat this fever?" (e.g., medications)       Tylenol    Fluids    7. IMMUNOCOMPROMISE: "Do you have of the following: diabetes, HIV positive, splenectomy, cancer chemotherapy, chronic steroid treatment, transplant patient, etc."       no 8. PREGNANCY: "Is there any chance you are pregnant?" "When was your last menstrual period?"     no 9. TRAVEL: "Have you traveled out of the country in the last month?" (e.g., travel history, exposures)      no  Protocols used: FEVER-A-AH

## 2016-12-18 NOTE — ED Provider Notes (Signed)
[facility] Emergency Department Provider Note   ____________________________________________   First MD Initiated Contact with Patient 12/18/16 1631     (approximate)  I have reviewed the triage vital signs and the nursing notes.   HISTORY  Chief Complaint Fever    HPI Tiffany Campos is a 56 y.o. female elevation of fever  Patient reports that last 3-4 days she is felt generally ill, feverish at times, started just after seeing the doctor.  No cough no trouble breathing, no shortness of breath no chest pain.  Yesterday she had ongoing moderate to severe left flank pain, also has noticed changes in urination and discomfort with urination.  Saw her doctor today who is concerned that she has urinary tract infection and possible "sepsis" as this was sent to the ER for further evaluation.  Currently reports a mild to moderate pain in the left flank and back around her "kidney".  It is better than yesterday but is worse when she goes to walk stand or move.  She does have a history of kidney stones and felt sort of as though she could have been passing a kidney stone yesterday.  Reports no allergy to NSAIDs, and she can take ibuprofen from time to time but tries to avoid NSAIDs due to previous lap band.  Some nausea but no vomiting.  No diarrhea.  Decreased appetite  Past Medical History:  Diagnosis Date  . Anxiety   . Arthritis   . Diverticulosis   . GERD (gastroesophageal reflux disease)   . Hyperlipidemia   . Hypertension   . Joint pain   . Kidney stones   . 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 Stress incontinence     Patient Active Problem List   Diagnosis Date Noted  . Arthritis of carpometacarpal (CMC) joints of both thumbs 02/29/2016  . Edema 12/14/2015  . Low back pain 11/05/2015  . Trochanteric bursitis, left hip 11/05/2015  . Syncope 07/21/2015  . Fatigue 07/21/2015  . B12 deficiency 07/21/2015  .  Routine general medical examination at a health care facility 11/11/2014  . Advance care planning 11/11/2014  . Rash and nonspecific skin eruption 11/11/2014  . Allergic dermatitis 11/03/2014  . Obesity 11/06/2013  . Essential hypertension 11/06/2013  . OA (osteoarthritis) 11/06/2013  . Generalized anxiety disorder 11/06/2013  . GERD (gastroesophageal reflux disease) 11/06/2013  . Lapband APS + [facility] repair August 2011 03/13/2013    Past Surgical History:  Procedure Laterality Date  . ABDOMINAL HYSTERECTOMY    . CHOLECYSTECTOMY    . ECTOPIC PREGNANCY SURGERY    . LAPAROSCOPIC GASTRIC BANDING  08/03/2009  . ROTATOR CUFF REPAIR  05-25-11   right, with distal clavicle resection  . SHOULDER ARTHROSCOPY    . tmj    . TONSILLECTOMY AND ADENOIDECTOMY      Prior to Admission medications   Medication Sig Start Date End Date Taking? Authorizing Provider  celecoxib (CELEBREX) 200 MG capsule TAKE 1 CAPSULE (200 MG TOTAL) BY MOUTH DAILY. 12/14/16  Yes Joaquim Nam, MD  cyanocobalamin (,VITAMIN B-12,) 1000 MCG/ML injection INJECT (1 MILLILITER) EVERY 14 DAYS. 11/27/16  Yes Joaquim Nam, MD  DULoxetine (CYMBALTA) 20 MG capsule Take 1 capsule (20 mg total) by mouth daily. 12/14/16  Yes Joaquim Nam, MD  hydrochlorothiazide (HYDRODIURIL) 25 MG tablet TAKE 1 TABLET (25 MG TOTAL) BY MOUTH DAILY. 11/28/16  Yes Joaquim Nam, MD  pantoprazole (PROTONIX)  40 MG tablet TAKE 1 TABLET (40 MG TOTAL) BY MOUTH DAILY. 11/28/16  Yes Joaquim Namuncan, Graham S, MD  ALPRAZolam Prudy Feeler(XANAX) 1 MG tablet TAKE 1 TABLET BY MOUTH TWICE A DAY AS NEEDED FOR ANXIETY 12/14/16   Joaquim Namuncan, Graham S, MD  Syringe/Needle, Disp, (SYRINGE 3CC/25GX1") 25G X 1" 3 ML MISC Use to inject 1 mL of Vitamin B12 into the muscle every 30 days. 07/27/15   Joaquim Namuncan, Graham S, MD  traMADol Janean Sark(ULTRAM) 50 MG tablet TAKE 1 TABLET BY MOUTH EVERY DAY AS NEEDED Patient not taking: Reported on 12/18/2016 10/31/16   Joaquim Namuncan, Graham S, MD     Allergies Lexapro [escitalopram oxalate]; Lodine [etodolac]; and Naproxen  Family History  Problem Relation Age of Onset  . Peripheral vascular disease Father   . GER disease Father   . Diabetes Mother   . Hypertension Mother   . Arthritis Mother   . GER disease Mother   . Cancer Mother        carcinoid tumor  . Colon cancer Maternal Uncle        possible colon cancer dx  . Breast cancer Neg Hx     Social History Social History   Tobacco Use  . Smoking status: Never Smoker  . Smokeless tobacco: Never Used  Substance Use Topics  . Alcohol use: Yes    Alcohol/week: 8.4 oz    Types: 14 Glasses of wine per week  . Drug use: No    Review of Systems Constitutional: Fevers chills and fatigue eyes: No visual changes. ENT: No sore throat. Cardiovascular: Denies chest pain. Respiratory: Denies shortness of breath. Gastrointestinal: No abdominal pain rather reports discomfort in the left flank and kidney area better today than yesterday.  No vomiting.  No diarrhea.  No constipation. Genitourinary: Negative for dysuria. Musculoskeletal: Negative for back pain except in the left flank. Skin: Negative for rash. Neurological: Negative for headaches, focal weakness or numbness.    ____________________________________________   PHYSICAL EXAM:  VITAL SIGNS: ED Triage Vitals  Enc Vitals Group     BP 12/18/16 1618 106/64     Pulse Rate 12/18/16 1618 (!) 102     Resp 12/18/16 1618 (!) 8     Temp 12/18/16 1618 (!) 103.1 F (39.5 C)     Temp Source 12/18/16 1618 Oral     SpO2 12/18/16 1618 94 %     Weight 12/18/16 1619 173 lb (78.5 kg)     Height 12/18/16 1619 5\' 6"  (1.676 m)     Head Circumference --      Peak Flow --      Pain Score 12/18/16 1618 7     Pain Loc --      Pain Edu? --      Excl. in GC? --     Constitutional: Alert and oriented. Well appearing and in no acute distress. Eyes: Conjunctivae are normal. Head: Atraumatic. Nose: No  congestion/rhinnorhea. Mouth/Throat: Mucous membranes are slightly dry.  No meningismus.  No tonsillar exudates Neck: No stridor.   Cardiovascular: Mild tachycardic rate, regular rhythm. Grossly normal heart sounds.  Good peripheral circulation. Respiratory: Normal respiratory effort.  No retractions. Lungs CTAB. Gastrointestinal: Soft and nontender. No distention.  Severe left-sided CVA tenderness, no CVA tenderness on the right. Musculoskeletal: No lower extremity tenderness nor edema. Neurologic:  Normal speech and language. No gross focal neurologic deficits are appreciated.  Skin:  Skin is notably quite warm, dry and intact. No rash noted. Psychiatric: Mood and affect are normal. Speech  and behavior are normal.  ____________________________________________   LABS (all labs ordered are listed, but only abnormal results are displayed)  Labs Reviewed  COMPREHENSIVE METABOLIC PANEL - Abnormal; Notable for the following components:      Result Value   Sodium 132 (*)    Potassium 2.7 (*)    Chloride 96 (*)    Glucose, Bld 125 (*)    Calcium 8.8 (*)    Albumin 3.4 (*)    AST 105 (*)    ALT 180 (*)    Alkaline Phosphatase 224 (*)    Total Bilirubin 1.6 (*)    All other components within normal limits  URINE CULTURE  CULTURE, BLOOD (ROUTINE X 2)  CULTURE, BLOOD (ROUTINE X 2)  LACTIC ACID, PLASMA  LACTIC ACID, PLASMA  CBC WITH DIFFERENTIAL/PLATELET  URINALYSIS, COMPLETE (UACMP) WITH MICROSCOPIC   ____________________________________________  EKG   ____________________________________________  RADIOLOGY  Ct Renal Stone Study  Result Date: 12/18/2016 CLINICAL DATA:  Fever and body aches for 4 days. Left flank and back pain. Mild dysuria. EXAM: CT ABDOMEN AND PELVIS WITHOUT CONTRAST TECHNIQUE: Multidetector CT imaging of the abdomen and pelvis was performed following the standard protocol without IV contrast. COMPARISON:  None. FINDINGS: Lower chest:  Subsegmental  atelectasis noted in the lingula. Hepatobiliary: No focal abnormality in the liver on this study without intravenous contrast. Gallbladder surgically absent. No intrahepatic or extrahepatic biliary dilation. Pancreas: No focal mass lesion. No dilatation of the main duct. No intraparenchymal cyst. No peripancreatic edema. Spleen: No splenomegaly. No focal mass lesion. Adrenals/Urinary Tract: No adrenal nodule or mass. Right kidney unremarkable. No right ureteral stone. Several tiny (1-2 mm) nonobstructing stones are seen in the upper pole the left kidney 2 mm nonobstructing stone identified interpolar left kidney. 2 x 4 x 5 mm stone is identified in the distal left ureter 1-2 cm proximal to the UVJ. No bladder stones. Stomach/Bowel: A lap band is identified at the proximal stomach. There is a tiny associated hiatal hernia. Stomach otherwise unremarkable. Duodenum is normally positioned as is the ligament of Treitz. No small bowel wall thickening. No small bowel dilatation. The terminal ileum is normal. Cecum high in the right upper quadrant The appendix is normal. Diverticular changes are noted in the left colon without evidence of diverticulitis. Vascular/Lymphatic: No abdominal aortic aneurysm. There is no gastrohepatic or hepatoduodenal ligament lymphadenopathy. No intraperitoneal or retroperitoneal lymphadenopathy. No pelvic sidewall lymphadenopathy Reproductive: Uterus surgically absent.  There is no adnexal mass. Other: No intraperitoneal free fluid. Musculoskeletal: Bone windows reveal no worrisome lytic or sclerotic osseous lesions. IMPRESSION: 1. 2 x 4 x 5 mm stone distal left ureter about 1-2 cm proximal to the UVJ. This causes mild left hydroureteronephrosis. 2. Multiple tiny nonobstructing left renal stones. 3. Status post lap band surgery with tiny hiatal hernia evident. Electronically Signed   By: Kennith Center M.D.   On: 12/18/2016 18:00   CT reviewed, distal left ureteral stone with hydronephrosis.   Small hiatal hernia   ____________________________________________   PROCEDURES  Procedure(s) performed: None  Procedures  Critical Care performed: No  ____________________________________________   INITIAL IMPRESSION / ASSESSMENT AND PLAN / ED COURSE  Pertinent labs & imaging results that were available during my care of the patient were reviewed by me and considered in my medical decision making (see chart for details).  Patient presents for evaluation of fever, fatigue, with associated left flank pain.  She does have a history of kidney stones and yesterday felt as though she  was passing 1, now has notable fever tachycardia and some softer blood pressures.  Sent from doctor's office for concerns of sepsis.  She does meet sepsis criteria, based on the clinical history given in the sample reviewed from her doctor's office I am concerned about possible pyelonephritis as a primary, though I do also wish to rule out infected kidney stone or other acute intra-abdominal process given the left flank pain associated.  Denies any upper respiratory symptoms, negative flu negative strep.  No trouble breathing.  Start fluids, check lactate, blood cultures, cefepime based on hospital recommendations for sepsis care for probable UTI source    ----------------------------------------- 6:30 PM on 12/18/2016 -----------------------------------------  ED Sepsis - Repeat Assessment   Performed at:    ----------------------------------------- 6:30 PM on 12/18/2016 -----------------------------------------    Last Vitals:    Blood pressure 115/68, pulse 88, temperature (!) 103.1 F (39.5 C), temperature source Oral, resp. rate 15, height 5\' 6"  (1.676 m), weight 78.5 kg (173 lb), SpO2 93 %.  Heart:      Clear tones  Lungs:     Clear lungs  Capillary Refill:   Normal less than 1 second  Peripheral Pulse (include location): Right radial   Skin (include color):   Warm well  perfused  Patient reports feeling improved, blood pressure and heart rate are improving.  Have discussed case with Dr. Mena GoesEskridge of urology, he is coming to perform consult urgently for concerns of a possible septic stone.  Patient is aware, remains n.p.o.  Potassium slightly low and CBC is pending.  Will replete potassium.  Discussed with Dr. Katheren ShamsSalary, hospitalist service will admit.  Also discussed slightly elevated transaminases with both Dr. Katheren ShamsSalary and Dr. Mena GoesEskridge, but CT negative for acute hepatobiliary pathology and she is previous cholecystectomy.  Patient reports pain well controlled, understanding of plan for urology consult and admission at this time.  Patient has not provided urinalysis in the ER for culture, but she did provide urinalysis at her provider earlier which is viewable evidenced for infection   ____________________________________________   FINAL CLINICAL IMPRESSION(S) / ED DIAGNOSES  Final diagnoses:  Sepsis, due to unspecified organism [facility](HCC)  Kidney stone on left side  Pyelonephritis  Hypokalemia Transaminitis Urinary tract infection acute   NEW MEDICATIONS STARTED DURING THIS VISIT:  This SmartLink is deprecated. Use AVSMEDLIST instead to display the medication list for a patient.   Note:  This document was prepared using Dragon voice recognition software and may include unintentional dictation errors.     Sharyn CreamerQuale, Archie Atilano, MD 12/18/16 [id number]

## 2016-12-18 NOTE — H&P (Signed)
[employer] - [place] [facility]   PATIENT NAME: Tiffany Campos    MR#:  [id number]  DATE OF BIRTH:  [birth year]  DATE OF ADMISSION:  12/18/2016  PRIMARY CARE PHYSICIAN: Joaquim Nam, MD   REQUESTING/REFERRING PHYSICIAN:   CHIEF COMPLAINT:   Chief Complaint  Patient presents with  . Fever    HISTORY OF PRESENT ILLNESS: Tiffany Campos  is a 56 y.o. female with a known history per below presenting with 5-day history of generalized weakness, fatigue, generalized body ache, developed worsening left-sided flank pain last 24 hours, in the emergency room patient was found to have left ureteral stone with mild hydronephrosis, septic picture with fever of 103.1, tachycardia, white count 16,000, sodium 132, potassium 2.7, chloride 97, urology notified-patient currently in route to OR for operative intervention, patient resting comfortably in bed-noted mild to moderate discomfort intermittently, patient is now been admitted with acute left ureteral stone with associated hydronephrosis, sepsis secondary to urinary tract infection.  PAST MEDICAL HISTORY:   Past Medical History:  Diagnosis Date  . Anxiety   . Arthritis   . Diverticulosis   . GERD (gastroesophageal reflux disease)   . Hyperlipidemia   . Hypertension   . Joint pain   . Kidney stones   . 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 Stress incontinence     PAST SURGICAL HISTORY:  Past Surgical History:  Procedure Laterality Date  . ABDOMINAL HYSTERECTOMY    . CHOLECYSTECTOMY    . ECTOPIC PREGNANCY SURGERY    . LAPAROSCOPIC GASTRIC BANDING  08/03/2009  . ROTATOR CUFF REPAIR  05-25-11   right, with distal clavicle resection  . SHOULDER ARTHROSCOPY    . tmj    . TONSILLECTOMY AND ADENOIDECTOMY      SOCIAL HISTORY:  Social History   Tobacco Use  . Smoking status: Never Smoker  . Smokeless tobacco: Never Used  Substance Use Topics  . Alcohol use: Yes     Alcohol/week: 8.4 oz    Types: 14 Glasses of wine per week    FAMILY HISTORY:  Family History  Problem Relation Age of Onset  . Peripheral vascular disease Father   . GER disease Father   . Diabetes Mother   . Hypertension Mother   . Arthritis Mother   . GER disease Mother   . Cancer Mother        carcinoid tumor  . Colon cancer Maternal Uncle        possible colon cancer dx  . Breast cancer Neg Hx     DRUG ALLERGIES:  Allergies  Allergen Reactions  . Lexapro [Escitalopram Oxalate] Other (See Comments)    Worsening mood  . Lodine [Etodolac] Other (See Comments)    GERD- tolerates celebrex  . Naproxen Other (See Comments)    GERD- tolerates celebrex    REVIEW OF SYSTEMS:   CONSTITUTIONAL: + fever/fatigue/weakness.  EYES: No blurred or double vision.  EARS, NOSE, AND THROAT: No tinnitus or ear pain.  RESPIRATORY: No cough, shortness of breath, wheezing or hemoptysis.  CARDIOVASCULAR: No chest pain, orthopnea, edema.  GASTROINTESTINAL: +No nausea,no vomiting, diarrhea,+ left flank pain.  GENITOURINARY: No dysuria, hematuria.  ENDOCRINE: No polyuria, nocturia,  HEMATOLOGY: No anemia, easy bruising or bleeding SKIN: No rash or lesion. MUSCULOSKELETAL: No joint pain or arthritis.   NEUROLOGIC: No tingling, numbness, weakness.  PSYCHIATRY: No anxiety or depression.   MEDICATIONS AT HOME:  Prior to Admission medications  Medication Sig Start Date End Date Taking? Authorizing Provider  celecoxib (CELEBREX) 200 MG capsule TAKE 1 CAPSULE (200 MG TOTAL) BY MOUTH DAILY. 12/14/16  Yes Joaquim Nam, MD  cyanocobalamin (,VITAMIN B-12,) 1000 MCG/ML injection INJECT (1 MILLILITER) EVERY 14 DAYS. 11/27/16  Yes Joaquim Nam, MD  DULoxetine (CYMBALTA) 20 MG capsule Take 1 capsule (20 mg total) by mouth daily. 12/14/16  Yes Joaquim Nam, MD  hydrochlorothiazide (HYDRODIURIL) 25 MG tablet TAKE 1 TABLET (25 MG TOTAL) BY MOUTH DAILY. 11/28/16  Yes Joaquim Nam, MD   pantoprazole (PROTONIX) 40 MG tablet TAKE 1 TABLET (40 MG TOTAL) BY MOUTH DAILY. 11/28/16  Yes Joaquim Nam, MD  ALPRAZolam Prudy Feeler) 1 MG tablet TAKE 1 TABLET BY MOUTH TWICE A DAY AS NEEDED FOR ANXIETY 12/14/16   Joaquim Nam, MD  Syringe/Needle, Disp, (SYRINGE 3CC/25GX1") 25G X 1" 3 ML MISC Use to inject 1 mL of Vitamin B12 into the muscle every 30 days. 07/27/15   Joaquim Nam, MD  traMADol Janean Sark) 50 MG tablet TAKE 1 TABLET BY MOUTH EVERY DAY AS NEEDED Patient not taking: Reported on 12/18/2016 10/31/16   Joaquim Nam, MD      PHYSICAL EXAMINATION:   VITAL SIGNS: Blood pressure 115/68, pulse 88, temperature (!) 103.1 F (39.5 C), temperature source Oral, resp. rate 15, height 5\' 6"  (1.676 m), weight 78.5 kg (173 lb), SpO2 93 %.  GENERAL:  56 y.o.-year-old patient lying in the bed with mild to moderate acute distress.  EYES: Pupils equal, round, reactive to light and accommodation. No scleral icterus. Extraocular muscles intact.  HEENT: Head atraumatic, normocephalic. Oropharynx and nasopharynx clear.  NECK:  Supple, no jugular venous distention. No thyroid enlargement, no tenderness.  LUNGS: Normal breath sounds bilaterally, no wheezing, rales,rhonchi or crepitation. No use of accessory muscles of respiration.  CARDIOVASCULAR: S1, S2 normal. No murmurs, rubs, or gallops.  ABDOMEN: Soft, nontender, nondistended. Bowel sounds present. No organomegaly or mass.  Left CVA tenderness EXTREMITIES: No pedal edema, cyanosis, or clubbing.  NEUROLOGIC: Cranial nerves II through XII are intact. MAES. Gait not checked.  PSYCHIATRIC: The patient is alert and oriented x 3.  SKIN: No obvious rash, lesion, or ulcer.   LABORATORY PANEL:   CBC Recent Labs  Lab 12/18/16 1711  WBC 16.2*  HGB 14.1  HCT 41.6  PLT 185  MCV 91.7  MCH 31.1  MCHC 33.9  RDW 12.4  LYMPHSABS 0.5*  MONOABS 2.4*  EOSABS 0.0  BASOSABS 0.0    ------------------------------------------------------------------------------------------------------------------  Chemistries  Recent Labs  Lab 12/13/16 1057 12/18/16 1711  NA 140 132*  K 4.6 2.7*  CL 104 96*  CO2 30 25  GLUCOSE 89 125*  BUN 13 19  CREATININE 0.78 0.85  CALCIUM 9.3 8.8*  AST 23 105*  ALT 17 180*  ALKPHOS 83 224*  BILITOT 0.8 1.6*   ------------------------------------------------------------------------------------------------------------------ estimated creatinine clearance is 77.2 mL/min (by C-G formula based on SCr of 0.85 mg/dL). ------------------------------------------------------------------------------------------------------------------ No results for input(s): TSH, T4TOTAL, T3FREE, THYROIDAB in the last 72 hours.  Invalid input(s): FREET3   Coagulation profile No results for input(s): INR, PROTIME in the last 168 hours. ------------------------------------------------------------------------------------------------------------------- No results for input(s): DDIMER in the last 72 hours. -------------------------------------------------------------------------------------------------------------------  Cardiac Enzymes No results for input(s): CKMB, TROPONINI, MYOGLOBIN in the last 168 hours.  Invalid input(s): CK ------------------------------------------------------------------------------------------------------------------ Invalid input(s): POCBNP  ---------------------------------------------------------------------------------------------------------------  Urinalysis    Component Value Date/Time   COLORURINE YELLOW (A) 12/18/2016 1711   APPEARANCEUR CLEAR (  A) 12/18/2016 1711   LABSPEC 1.008 12/18/2016 1711   PHURINE 6.0 12/18/2016 1711   GLUCOSEU NEGATIVE 12/18/2016 1711   HGBUR MODERATE (A) 12/18/2016 1711   BILIRUBINUR NEGATIVE 12/18/2016 1711   BILIRUBINUR moderate (A) 12/18/2016 1551   KETONESUR NEGATIVE 12/18/2016 1711    PROTEINUR 30 (A) 12/18/2016 1711   UROBILINOGEN >=8.0 (A) 12/18/2016 1551   NITRITE NEGATIVE 12/18/2016 1711   LEUKOCYTESUR SMALL (A) 12/18/2016 1711     RADIOLOGY: Ct Renal Stone Study  Result Date: 12/18/2016 CLINICAL DATA:  Fever and body aches for 4 days. Left flank and back pain. Mild dysuria. EXAM: CT ABDOMEN AND PELVIS WITHOUT CONTRAST TECHNIQUE: Multidetector CT imaging of the abdomen and pelvis was performed following the standard protocol without IV contrast. COMPARISON:  None. FINDINGS: Lower chest:  Subsegmental atelectasis noted in the lingula. Hepatobiliary: No focal abnormality in the liver on this study without intravenous contrast. Gallbladder surgically absent. No intrahepatic or extrahepatic biliary dilation. Pancreas: No focal mass lesion. No dilatation of the main duct. No intraparenchymal cyst. No peripancreatic edema. Spleen: No splenomegaly. No focal mass lesion. Adrenals/Urinary Tract: No adrenal nodule or mass. Right kidney unremarkable. No right ureteral stone. Several tiny (1-2 mm) nonobstructing stones are seen in the upper pole the left kidney 2 mm nonobstructing stone identified interpolar left kidney. 2 x 4 x 5 mm stone is identified in the distal left ureter 1-2 cm proximal to the UVJ. No bladder stones. Stomach/Bowel: A lap band is identified at the proximal stomach. There is a tiny associated hiatal hernia. Stomach otherwise unremarkable. Duodenum is normally positioned as is the ligament of Treitz. No small bowel wall thickening. No small bowel dilatation. The terminal ileum is normal. Cecum high in the right upper quadrant The appendix is normal. Diverticular changes are noted in the left colon without evidence of diverticulitis. Vascular/Lymphatic: No abdominal aortic aneurysm. There is no gastrohepatic or hepatoduodenal ligament lymphadenopathy. No intraperitoneal or retroperitoneal lymphadenopathy. No pelvic sidewall lymphadenopathy Reproductive: Uterus  surgically absent.  There is no adnexal mass. Other: No intraperitoneal free fluid. Musculoskeletal: Bone windows reveal no worrisome lytic or sclerotic osseous lesions. IMPRESSION: 1. 2 x 4 x 5 mm stone distal left ureter about 1-2 cm proximal to the UVJ. This causes mild left hydroureteronephrosis. 2. Multiple tiny nonobstructing left renal stones. 3. Status post lap band surgery with tiny hiatal hernia evident. Electronically Signed   By: Kennith CenterEric  Mansell M.D.   On: 12/18/2016 18:00    EKG: Orders placed or performed during the hospital encounter of 07/16/15  . ED EKG  . ED EKG  . EKG 12-Lead  . EKG 12-Lead    IMPRESSION AND PLAN: 1 acute septicemia Secondary to urinary tract infection, left ureteral stone with hydronephrosis Admit to regular nursing floor bed with telemetry on our sepsis protocol, empiric Rocephin, follow-up on cultures, urology following-for operative intervention later tonight, IV fluids rehydration, and continue close medical monitoring  2 acute urinary tract infection Most likely secondary to acute left ureteral stone, cannot rule out possible pyelonephritis Plan of care as stated above  3 acute left ureteral stone with mild hydronephrosis Plan of care as stated above  4 chronic benign essential hypertension Hold antihypertensives for now, vitals per routine, make changes as per necessary  5 chronic GERD without esophagitis PPI daily  Full code Condition stable Prognosis fair DVT prophylaxis with heparin subcu Disposition pending clinical course in 2-4 days back to home  All the records are reviewed and case discussed with ED  provider. Management plans discussed with the patient, family and they are in agreement.  CODE STATUS: Code Status History    This patient does not have a recorded code status. Please follow your organizational policy for patients in this situation.       TOTAL TIME TAKING CARE OF THIS PATIENT: 45 minutes.    Evelena AsaMontell D Salary  M.D on 12/18/2016   Between 7am to 6pm - Pager - [phone]  After 6pm go to [email or web address] - password [email or web address] [email or web address]  [facility]  Office  [phone]  CC: Primary care physician; Joaquim Namuncan, Graham S, MD   Note: This dictation was prepared with Dragon dictation along with smaller phrase technology. Any transcriptional errors that result from this process are unintentional.

## 2016-12-18 NOTE — Anesthesia Procedure Notes (Signed)
Procedure Name: Intubation Date/Time: 12/18/2016 9:10 PM Performed by: Nelda Marseille, CRNA Pre-anesthesia Checklist: Patient identified, Patient being monitored, Timeout performed, Emergency Drugs available and Suction available Patient Re-evaluated:Patient Re-evaluated prior to induction Oxygen Delivery Method: Circle system utilized Preoxygenation: Pre-oxygenation with 100% oxygen Induction Type: IV induction Ventilation: Mask ventilation without difficulty Laryngoscope Size: Mac, 3 and McGraph Grade View: Grade IV Tube type: Oral Tube size: 7.0 mm Number of attempts: 1 Airway Equipment and Method: Stylet and Video-laryngoscopy Placement Confirmation: ETT inserted through vocal cords under direct vision,  positive ETCO2 and breath sounds checked- equal and bilateral Secured at: 21 cm Tube secured with: Tape Dental Injury: Teeth and Oropharynx as per pre-operative assessment  Difficulty Due To: Difficulty was anticipated, Difficult Airway- due to large tongue and Difficult Airway- due to anterior larynx

## 2016-12-18 NOTE — Progress Notes (Signed)
Patient ID: Tiffany Campos, female   DOB: 10/05/1960, 56 y.o.   MRN: [id number]  PCP: Joaquim Namuncan, Graham S, MD  Subjective:  Tiffany Campos is a 56 y.o. year old very pleasant female patient who presents with flu like symptoms including fever with Tmax of 103 F, nausea, headache, ,body aches. Last acetaminophen dose this morning.  -other symptoms: decreased appetite, left flank pain. Left flank pain has improved per patient with increasing water. She reports dark urine is still present. -started: 4 days ago -inside 48 hour treatment window if needed for tamiflu: No -high risk condition: No -symptoms are not improving  -previous treatments: Tylenol which has provided limited benefit for discomfort. Has decreased temperature. - patient did receive flu shot this year.  -  sick contact; specifically influenza: No  ROS-denies SOB, sinus or dental pain  Pertinent Past Medical History- HTN, GERD, GAD, history of lower back pain. Patient reports this as fibromyalgia. Patient Active Problem List   Diagnosis Date Noted  . Arthritis of carpometacarpal (CMC) joints of both thumbs 02/29/2016  . Edema 12/14/2015  . Low back pain 11/05/2015  . Trochanteric bursitis, left hip 11/05/2015  . Syncope 07/21/2015  . Fatigue 07/21/2015  . B12 deficiency 07/21/2015  . Routine general medical examination at a health care facility 11/11/2014  . Advance care planning 11/11/2014  . Rash and nonspecific skin eruption 11/11/2014  . Allergic dermatitis 11/03/2014  . Obesity 11/06/2013  . Essential hypertension 11/06/2013  . OA (osteoarthritis) 11/06/2013  . Generalized anxiety disorder 11/06/2013  . GERD (gastroesophageal reflux disease) 11/06/2013  . [facility] 03/13/2013    Medications- reviewed  Current Outpatient Medications  Medication Sig Dispense Refill  . ALPRAZolam (XANAX) 1 MG tablet TAKE 1 TABLET BY MOUTH TWICE A DAY AS NEEDED FOR ANXIETY 60 tablet 1  . celecoxib  (CELEBREX) 200 MG capsule TAKE 1 CAPSULE (200 MG TOTAL) BY MOUTH DAILY. 90 capsule 1  . cyanocobalamin (,VITAMIN B-12,) 1000 MCG/ML injection INJECT 1000MCG (1 MILLILITER) EVERY 14 DAYS. 10 mL 0  . DULoxetine (CYMBALTA) 20 MG capsule Take 1 capsule (20 mg total) by mouth daily. 90 capsule 3  . hydrochlorothiazide (HYDRODIURIL) 25 MG tablet TAKE 1 TABLET (25 MG TOTAL) BY MOUTH DAILY. 90 tablet 3  . pantoprazole (PROTONIX) 40 MG tablet TAKE 1 TABLET (40 MG TOTAL) BY MOUTH DAILY. 90 tablet 3  . Syringe/Needle, Disp, (SYRINGE 3CC/25GX1") 25G X 1" 3 ML MISC Use to inject 1 mL of Vitamin B12 into the muscle every 30 days. 12 each 0  . traMADol (ULTRAM) 50 MG tablet TAKE 1 TABLET BY MOUTH EVERY DAY AS NEEDED 30 tablet 1   No current facility-administered medications for this visit.     Objective: BP 110/70 (BP Location: Left Arm, Patient Position: Sitting, Cuff Size: Normal)   Pulse (!) 111   Temp (!) 103.1 F (39.5 C) (Oral)   Resp 17   Ht 5\' 6"  (1.676 m)   Wt 177 lb 8 oz (80.5 kg)   SpO2 95%   BMI 28.65 kg/m  Gen: Appears acutely ill lying on exam table, appears fatigued HEENT: Turbinates mildly erythematous, TMs normal bilaterally, pharynx mildly erythematous with no tonsilar exudate or edema, no sinus tenderness CV: RRR no murmurs rubs or gallops Lungs: CTAB no crackles, wheeze, rhonchi Abdomen: soft/nontender/nondistended/normal bowel sounds. Suprapubic tenderness present; Positive for CVA tenderness bilaterally; discomfort noted in lower back when going from a lying to sitting position. Tenderness to palpation across lower back  that is general in nature. Patient states discomfort is noted across her entire back. Ext: no edema Skin: warm, dry, no rash  Assessment/Plan: 1. Flank pain UA is + for leukocytes, nitrites, blood, moderate bilirubin, and small ketones; urine color is orange. Acutely ill presentation, T: 69103 F, and worsening symptoms; concern for sepsis and possible renal  stones. Further evaluation is needed. Advised patient go immediately to the ED for further evaluation and treatment. She is accompanied by her husband and has agreed to go at this time.   2. Flu-like symptoms POC flu is negative; myalgias present; further evaluation with UA and lack of developing respiratory symptoms provide low suspicion for influenza. - POC Influenza A&B (Binax test)  3. Fever, unspecified fever cause Rapid strep is negative;  - POCT rapid strep A  4. Acute low back pain without sciatica, unspecified back pain laterality Generalized pain is noted and concern for urinary sepsis/stones with + UA and also CVA tenderness that is present is concerning for pyelonephhritis.  - POCT urinalysis dipstick  Patient agreed to go immediately to the ED with her husband and did not want transport via ambulance. We discussed the concern for sepsis and need for immediate treatment. Patient stated that she did not want to to the ED but understands the need to do so and will go with her husband driving her.  Inez CatalinaJulia Ann Charnita Trudel, FNP

## 2016-12-18 NOTE — Patient Instructions (Signed)
Please go the emergency room for further evaluation and treatment.

## 2016-12-19 ENCOUNTER — Telehealth: Payer: Self-pay | Admitting: Urology

## 2016-12-19 ENCOUNTER — Encounter: Payer: Self-pay | Admitting: Urology

## 2016-12-19 DIAGNOSIS — A419 Sepsis, unspecified organism: Principal | ICD-10-CM

## 2016-12-19 DIAGNOSIS — N12 Tubulo-interstitial nephritis, not specified as acute or chronic: Secondary | ICD-10-CM

## 2016-12-19 DIAGNOSIS — N200 Calculus of kidney: Secondary | ICD-10-CM

## 2016-12-19 LAB — BASIC METABOLIC PANEL
Anion gap: 7 (ref 5–15)
BUN: 17 mg/dL (ref 6–20)
CHLORIDE: 103 mmol/L (ref 101–111)
CO2: 26 mmol/L (ref 22–32)
Calcium: 8.5 mg/dL — ABNORMAL LOW (ref 8.9–10.3)
Creatinine, Ser: 0.78 mg/dL (ref 0.44–1.00)
GFR calc Af Amer: >60 mL/min (ref >60)
GFR calc non Af Amer: >60 mL/min (ref >60)
GLUCOSE: 188 mg/dL — AB (ref 65–99)
POTASSIUM: 3.2 mmol/L — AB (ref 3.5–5.1)
SODIUM: 136 mmol/L (ref 135–145)

## 2016-12-19 LAB — CBC
HEMATOCRIT: 37.8 % (ref 35.0–47.0)
Hemoglobin: 12.8 g/dL (ref 12.0–16.0)
MCH: 31.6 pg (ref 26.0–34.0)
MCHC: 33.9 g/dL (ref 32.0–36.0)
MCV: 93.4 fL (ref 80.0–100.0)
Platelets: 165 10*3/uL (ref 150–440)
RBC: 4.05 MIL/uL (ref 3.80–5.20)
RDW: 12.6 % (ref 11.5–14.5)
WBC: 13.9 10*3/uL — AB (ref 3.6–11.0)

## 2016-12-19 LAB — MAGNESIUM: Magnesium: 1.8 mg/dL (ref 1.7–2.4)

## 2016-12-19 LAB — LACTIC ACID, PLASMA: Lactic Acid, Venous: 1.1 mmol/L (ref 0.5–1.9)

## 2016-12-19 MED ORDER — HYDROCHLOROTHIAZIDE 25 MG PO TABS
25.0000 mg | ORAL_TABLET | Freq: Every day | ORAL | Status: DC
  Administered 2016-12-19 – 2016-12-20 (×2): 25 mg via ORAL
  Filled 2016-12-19 (×2): qty 1

## 2016-12-19 MED ORDER — PANTOPRAZOLE SODIUM 40 MG PO TBEC
40.0000 mg | DELAYED_RELEASE_TABLET | Freq: Every day | ORAL | Status: DC
  Administered 2016-12-19 – 2016-12-20 (×2): 40 mg via ORAL
  Filled 2016-12-19 (×2): qty 1

## 2016-12-19 MED ORDER — PREMIER PROTEIN SHAKE
11.0000 [oz_av] | Freq: Two times a day (BID) | ORAL | Status: DC
  Administered 2016-12-20: 11 [oz_av] via ORAL

## 2016-12-19 MED ORDER — MAGNESIUM SULFATE 2 GM/50ML IV SOLN
2.0000 g | Freq: Once | INTRAVENOUS | Status: AC
  Administered 2016-12-19: 2 g via INTRAVENOUS
  Filled 2016-12-19: qty 50

## 2016-12-19 MED ORDER — ALPRAZOLAM 1 MG PO TABS
1.0000 mg | ORAL_TABLET | Freq: Two times a day (BID) | ORAL | Status: DC | PRN
  Administered 2016-12-19 (×2): 1 mg via ORAL
  Filled 2016-12-19 (×2): qty 1

## 2016-12-19 MED ORDER — DULOXETINE HCL 20 MG PO CPEP
20.0000 mg | ORAL_CAPSULE | Freq: Every day | ORAL | Status: DC
  Administered 2016-12-19 – 2016-12-20 (×2): 20 mg via ORAL
  Filled 2016-12-19 (×2): qty 1

## 2016-12-19 NOTE — Progress Notes (Signed)
[facility] [place] at [facility]   PATIENT NAME: Tiffany Campos    MR#:  [id number]  DATE OF BIRTH:  04/26/1960  SUBJECTIVE:  CHIEF COMPLAINT:  Patient is out of bed to chair. Feeling better  REVIEW OF SYSTEMS:  CONSTITUTIONAL: No fever, fatigue or weakness.  EYES: No blurred or double vision.  EARS, NOSE, AND THROAT: No tinnitus or ear pain.  RESPIRATORY: No cough, shortness of breath, wheezing or hemoptysis.  CARDIOVASCULAR: No chest pain, orthopnea, edema.  GASTROINTESTINAL: No nausea, vomiting, diarrhea or abdominal pain.  GENITOURINARY: No dysuria, hematuria.  ENDOCRINE: No polyuria, nocturia,  HEMATOLOGY: No anemia, easy bruising or bleeding SKIN: No rash or lesion. MUSCULOSKELETAL: No joint pain or arthritis.   NEUROLOGIC: No tingling, numbness, weakness.  PSYCHIATRY: No anxiety or depression.   DRUG ALLERGIES:   Allergies  Allergen Reactions  . Lexapro [Escitalopram Oxalate] Other (See Comments)    Worsening mood  . Lodine [Etodolac] Other (See Comments)    GERD- tolerates celebrex  . Naproxen Other (See Comments)    GERD- tolerates celebrex    VITALS:  Blood pressure 121/63, pulse 66, temperature 98.5 F (36.9 C), temperature source Oral, resp. rate 18, height 5\' 6"  (1.676 m), weight 80.7 kg (177 lb 14.4 oz), SpO2 95 %.  PHYSICAL EXAMINATION:  GENERAL:  56 y.o.-year-old patient lying in the bed with no acute distress.  EYES: Pupils equal, round, reactive to light and accommodation. No scleral icterus. Extraocular muscles intact.  HEENT: Head atraumatic, normocephalic. Oropharynx and nasopharynx clear.  NECK:  Supple, no jugular venous distention. No thyroid enlargement, no tenderness.  LUNGS: Normal breath sounds bilaterally, no wheezing, rales,rhonchi or crepitation. No use of accessory muscles of respiration.  CARDIOVASCULAR: S1, S2 normal. No murmurs, rubs, or gallops.  ABDOMEN: Soft, nontender, nondistended. Bowel sounds  present. Left ureteral stent placement.  EXTREMITIES: No pedal edema, cyanosis, or clubbing.  NEUROLOGIC: Cranial nerves II through XII are intact. . Sensation intact. Gait not checked.  PSYCHIATRIC: The patient is alert and oriented x 3.  SKIN: No obvious rash, lesion, or ulcer.    LABORATORY PANEL:   CBC Recent Labs  Lab 12/19/16 0352  WBC 13.9*  HGB 12.8  HCT 37.8  PLT 165   ------------------------------------------------------------------------------------------------------------------  Chemistries  Recent Labs  Lab 12/18/16 1711 12/19/16 0352  NA 132* 136  K 2.7* 3.2*  CL 96* 103  CO2 25 26  GLUCOSE 125* 188*  BUN 19 17  CREATININE 0.85 0.78  CALCIUM 8.8* 8.5*  MG  --  1.8  AST 105*  --   ALT 180*  --   ALKPHOS 224*  --   BILITOT 1.6*  --    ------------------------------------------------------------------------------------------------------------------  Cardiac Enzymes No results for input(s): TROPONINI in the last 168 hours. ------------------------------------------------------------------------------------------------------------------  RADIOLOGY:  Ct Renal Stone Study  Result Date: 12/18/2016 CLINICAL DATA:  Fever and body aches for 4 days. Left flank and back pain. Mild dysuria. EXAM: CT ABDOMEN AND PELVIS WITHOUT CONTRAST TECHNIQUE: Multidetector CT imaging of the abdomen and pelvis was performed following the standard protocol without IV contrast. COMPARISON:  None. FINDINGS: Lower chest:  Subsegmental atelectasis noted in the lingula. Hepatobiliary: No focal abnormality in the liver on this study without intravenous contrast. Gallbladder surgically absent. No intrahepatic or extrahepatic biliary dilation. Pancreas: No focal mass lesion. No dilatation of the main duct. No intraparenchymal cyst. No peripancreatic edema. Spleen: No splenomegaly. No focal mass lesion. Adrenals/Urinary Tract: No adrenal nodule or mass. Right kidney unremarkable.  No right  ureteral stone. Several tiny (1-2 mm) nonobstructing stones are seen in the upper pole the left kidney 2 mm nonobstructing stone identified interpolar left kidney. 2 x 4 x 5 mm stone is identified in the distal left ureter 1-2 cm proximal to the UVJ. No bladder stones. Stomach/Bowel: A lap band is identified at the proximal stomach. There is a tiny associated hiatal hernia. Stomach otherwise unremarkable. Duodenum is normally positioned as is the ligament of Treitz. No small bowel wall thickening. No small bowel dilatation. The terminal ileum is normal. Cecum high in the right upper quadrant The appendix is normal. Diverticular changes are noted in the left colon without evidence of diverticulitis. Vascular/Lymphatic: No abdominal aortic aneurysm. There is no gastrohepatic or hepatoduodenal ligament lymphadenopathy. No intraperitoneal or retroperitoneal lymphadenopathy. No pelvic sidewall lymphadenopathy Reproductive: Uterus surgically absent.  There is no adnexal mass. Other: No intraperitoneal free fluid. Musculoskeletal: Bone windows reveal no worrisome lytic or sclerotic osseous lesions. IMPRESSION: 1. 2 x 4 x 5 mm stone distal left ureter about 1-2 cm proximal to the UVJ. This causes mild left hydroureteronephrosis. 2. Multiple tiny nonobstructing left renal stones. 3. Status post lap band surgery with tiny hiatal hernia evident. Electronically Signed   By: Kennith CenterEric  Mansell M.D.   On: 12/18/2016 18:00    EKG:   Orders placed or performed during the hospital encounter of 07/16/15  . ED EKG  . ED EKG  . EKG 12-Lead  . EKG 12-Lead    ASSESSMENT AND PLAN:   1 acute septicemia Secondary to urinary tract infection, left ureteral stone with hydronephrosis  empiric Rocephin, follow-up on urine and blood cultures,  urology following-cystoscopy with left retrograde pyelogram done and left ureteral stent placement IV fluids rehydration, and continue close medical monitoring  2 acute urinary tract  infection Most likely secondary to acute left ureteral stone, cannot rule out possible pyelonephritis IV Rocephin, IV fluids and follow up on the cultures  3 acute left ureteral stone with mild hydronephrosis urology following-cystoscopy with left retrograde pyelogram done and left ureteral stent placement  4 chronic benign essential hypertension Hold antihypertensives for now, vitals per routine, make changes as per necessary  5 chronic GERD without esophagitis PPI daily         All the records are reviewed and case discussed with Care Management/Social Workerr. Management plans discussed with the patient, family and they are in agreement.  CODE STATUS: fc   TOTAL TIME TAKING CARE OF THIS PATIENT: 36  minutes.   POSSIBLE D/C IN 1-2 DAYS, DEPENDING ON CLINICAL CONDITION.  Note: This dictation was prepared with Dragon dictation along with smaller phrase technology. Any transcriptional errors that result from this process are unintentional.   Ramonita LabGouru, Shruti Arrey M.D on 12/19/2016 at 4:59 PM  Between 7am to 6pm - Pager - [phone] After 6pm go to [email or web address] - password [email or web address] [facility]  [facility]  Office  [phone]  CC: Primary care physician; Joaquim Namuncan, Graham S, MD

## 2016-12-19 NOTE — Progress Notes (Signed)
Pt requesting xanax, pt takes xanax twice a day as needed at home, was not continued here. MD paged, Dr. Anne HahnWillis to put in orders. Will continue to monitor. Tiffany FriarAlexis Miller, RN, BSN

## 2016-12-19 NOTE — Telephone Encounter (Signed)
App made will notify patient  Tiffany DusterMichelle

## 2016-12-19 NOTE — Progress Notes (Signed)
Urology Consult Follow Up  Subjective: POD # 1  Emergent left ureteral stent placement  VSS afebrile this am.  Afebrile through the night.  WBC down to 13.9 from 16.2.  Serum creatinine at baseline.  Urine light pink in Foley.  Bag full.    Anti-infectives: Anti-infectives (From admission, onward)   Start     Dose/Rate Route Frequency Ordered Stop   12/19/16 0800  ceFEPIme (MAXIPIME) 1 g in dextrose 5 % 50 mL IVPB  Status:  Discontinued     1 g 100 mL/hr over 30 Minutes Intravenous Every 12 hours 12/18/16 1918 12/18/16 1950   12/18/16 2330  cefTRIAXone (ROCEPHIN) 1 g in dextrose 5 % 50 mL IVPB     1 g 100 mL/hr over 30 Minutes Intravenous Every 24 hours 12/18/16 2319     12/18/16 1930  cefTRIAXone (ROCEPHIN) 1 g in dextrose 5 % 50 mL IVPB - Premix  Status:  Discontinued     1 g 100 mL/hr over 30 Minutes Intravenous Every 24 hours 12/18/16 1927 12/18/16 2319   12/18/16 1900  ciprofloxacin (CIPRO) IVPB 400 mg     400 mg 200 mL/hr over 60 Minutes Intravenous  Once 12/18/16 1835 12/18/16 2119   12/18/16 1715  ceFEPIme (MAXIPIME) 2 g in dextrose 5 % 50 mL IVPB     2 g 100 mL/hr over 30 Minutes Intravenous  Once 12/18/16 1647 12/18/16 1852      Current Facility-Administered Medications  Medication Dose Route Frequency Provider Last Rate Last Dose  . 0.9 % NaCl with KCl 20 mEq/ L  infusion   Intravenous Continuous Salary, Montell D, MD 125 mL/hr at 12/19/16 0220    . acetaminophen (TYLENOL) tablet 650 mg  650 mg Oral Q6H PRN Salary, Montell D, MD       Or  . acetaminophen (TYLENOL) suppository 650 mg  650 mg Rectal Q6H PRN Salary, Montell D, MD      . ALPRAZolam Prudy Feeler(XANAX) tablet 1 mg  1 mg Oral BID PRN Oralia ManisWillis, David, MD   1 mg at 12/19/16 0211  . cefTRIAXone (ROCEPHIN) 1 g in dextrose 5 % 50 mL IVPB  1 g Intravenous Q24H Ramonita LabGouru, Aruna, MD   Stopped at 12/19/16 0055  . heparin injection 5,000 Units  5,000 Units Subcutaneous Q8H Salary, Montell D, MD   5,000 Units at 12/19/16 0006  .  HYDROcodone-acetaminophen (NORCO/VICODIN) 5-325 MG per tablet 1-2 tablet  1-2 tablet Oral Q4H PRN Salary, Montell D, MD      . ondansetron (ZOFRAN) tablet 4 mg  4 mg Oral Q6H PRN Salary, Montell D, MD       Or  . ondansetron (ZOFRAN) injection 4 mg  4 mg Intravenous Q6H PRN Salary, Montell D, MD      . polyethylene glycol (MIRALAX / GLYCOLAX) packet 17 g  17 g Oral Daily PRN Salary, Montell D, MD         Objective: Vital signs in last 24 hours: Temp:  [97.5 F (36.4 C)-103.1 F (39.5 C)] 97.5 F (36.4 C) (12/18 0356) Pulse Rate:  [54-111] 54 (12/18 0356) Resp:  [8-20] 17 (12/18 0356) BP: (92-115)/(53-70) 96/58 (12/18 0356) SpO2:  [92 %-97 %] 97 % (12/18 0356) Weight:  [173 lb (78.5 kg)-177 lb 14.4 oz (80.7 kg)] 177 lb 14.4 oz (80.7 kg) (12/17 2308)  Intake/Output from previous day: 12/17 0701 - 12/18 0700 In: 2240 [P.O.:240; I.V.:300; IV Piggyback:1700] Out: 231 [Urine:230; Blood:1] Intake/Output this shift: No intake/output data recorded.  Physical Exam Constitutional: Well nourished. Alert and oriented, No acute distress. HEENT: [place] AT, moist mucus membranes. Trachea midline, no masses.  Wearing glasses Cardiovascular: No clubbing, cyanosis, or edema. Respiratory: Normal respiratory effort, no increased work of breathing. GI: Abdomen is soft, non tender, non distended, no abdominal masses.  GU: No CVA tenderness.  No bladder fullness or masses.  Skin: No rashes, bruises or suspicious lesions. Lymph: No cervical or inguinal adenopathy. Neurologic: Grossly intact, no focal deficits, moving all 4 extremities. Psychiatric: Normal mood and affect.  Lab Results:  Recent Labs    12/18/16 1711 12/19/16 0352  WBC 16.2* 13.9*  HGB 14.1 12.8  HCT 41.6 37.8  PLT 185 165   BMET Recent Labs    12/18/16 1711 12/19/16 0352  NA 132* 136  K 2.7* 3.2*  CL 96* 103  CO2 25 26  GLUCOSE 125* 188*  BUN 19 17  CREATININE 0.85 0.78  CALCIUM 8.8* 8.5*   PT/INR No results for  input(s): LABPROT, INR in the last 72 hours. ABG No results for input(s): PHART, HCO3 in the last 72 hours.  Invalid input(s): PCO2, PO2  Studies/Results: Ct Renal Stone Study  Result Date: 12/18/2016 CLINICAL DATA:  Fever and body aches for 4 days. Left flank and back pain. Mild dysuria. EXAM: CT ABDOMEN AND PELVIS WITHOUT CONTRAST TECHNIQUE: Multidetector CT imaging of the abdomen and pelvis was performed following the standard protocol without IV contrast. COMPARISON:  None. FINDINGS: Lower chest:  Subsegmental atelectasis noted in the lingula. Hepatobiliary: No focal abnormality in the liver on this study without intravenous contrast. Gallbladder surgically absent. No intrahepatic or extrahepatic biliary dilation. Pancreas: No focal mass lesion. No dilatation of the main duct. No intraparenchymal cyst. No peripancreatic edema. Spleen: No splenomegaly. No focal mass lesion. Adrenals/Urinary Tract: No adrenal nodule or mass. Right kidney unremarkable. No right ureteral stone. Several tiny (1-2 mm) nonobstructing stones are seen in the upper pole the left kidney 2 mm nonobstructing stone identified interpolar left kidney. 2 x 4 x 5 mm stone is identified in the distal left ureter 1-2 cm proximal to the UVJ. No bladder stones. Stomach/Bowel: A lap band is identified at the proximal stomach. There is a tiny associated hiatal hernia. Stomach otherwise unremarkable. Duodenum is normally positioned as is the ligament of Treitz. No small bowel wall thickening. No small bowel dilatation. The terminal ileum is normal. Cecum high in the right upper quadrant The appendix is normal. Diverticular changes are noted in the left colon without evidence of diverticulitis. Vascular/Lymphatic: No abdominal aortic aneurysm. There is no gastrohepatic or hepatoduodenal ligament lymphadenopathy. No intraperitoneal or retroperitoneal lymphadenopathy. No pelvic sidewall lymphadenopathy Reproductive: Uterus surgically absent.   There is no adnexal mass. Other: No intraperitoneal free fluid. Musculoskeletal: Bone windows reveal no worrisome lytic or sclerotic osseous lesions. IMPRESSION: 1. 2 x 4 x 5 mm stone distal left ureter about 1-2 cm proximal to the UVJ. This causes mild left hydroureteronephrosis. 2. Multiple tiny nonobstructing left renal stones. 3. Status post lap band surgery with tiny hiatal hernia evident. Electronically Signed   By: Kennith CenterEric  Mansell M.D.   On: 12/18/2016 18:00     Assessment and Plan 1. Left obstructing distal ureteral stone with sepsis  - POD # 1 s/p emergent left ureteral stent placement  - may d/c Foley when ambulatory  - transition to po antibiotics upon discharge  - will need follow up to discuss definitive treatment for her stone - contacted my office to arrange appointments  2. Left hydronephrosis  - stent in place  - creatinine at base line      LOS: 1 day    Joyce [facility] 12/19/2016

## 2016-12-19 NOTE — Telephone Encounter (Signed)
Mrs. Tiffany Campos is in the hospital at this time, but she will need to follow up in the office to discuss treatment for her stone.

## 2016-12-19 NOTE — Telephone Encounter (Signed)
-----   Message from Jerilee FieldMatthew Eskridge, MD sent at 12/18/2016  9:37 PM EST ----- Needs f/u in about 2 weeks to plan ureteroscopy. S/p cysto, left stent for left distal stone and fever.

## 2016-12-19 NOTE — Telephone Encounter (Signed)
App has already been made and I will notify her of this.  Tiffany Campos

## 2016-12-19 NOTE — Plan of Care (Signed)
  Progressing Clinical Measurements: Will remain free from infection 12/19/2016 0730 - Progressing by Syliva OvermanMiller, Isela Stantz N, RN Diagnostic test results will improve 12/19/2016 0730 - Progressing by Syliva OvermanMiller, Abyan Cadman N, RN Activity: Risk for activity intolerance will decrease 12/19/2016 0730 - Progressing by Syliva OvermanMiller, Kazi Montoro N, RN Pain Managment: General experience of comfort will improve 12/19/2016 0730 - Progressing by Syliva OvermanMiller, Philomena Buttermore N, RN   Adequate for Discharge Education: Knowledge of General Education information will improve 12/19/2016 0730 - Adequate for Discharge by Syliva OvermanMiller, Phoua Hoadley N, RN

## 2016-12-19 NOTE — Progress Notes (Signed)
MEDICATION RELATED CONSULT NOTE - INITIAL   Pharmacy Consult for electrolyte monitoring Indication: hypokalemia  Allergies  Allergen Reactions  . Lexapro [Escitalopram Oxalate] Other (See Comments)    Worsening mood  . Lodine [Etodolac] Other (See Comments)    GERD- tolerates celebrex  . Naproxen Other (See Comments)    GERD- tolerates celebrex   Patient Measurements: Height: 5\' 6"  (167.6 cm) Weight: 177 lb 14.4 oz (80.7 kg) IBW/kg (Calculated) : 59.3  Vital Signs: Temp: 98.5 F (36.9 C) (12/18 1414) Temp Source: Oral (12/18 1414) BP: 121/63 (12/18 1414) Pulse Rate: 66 (12/18 1414) Intake/Output from previous day: 12/17 0701 - 12/18 0700 In: 2240 [P.O.:240; I.V.:300; IV Piggyback:1700] Out: 231 [Urine:230; Blood:1] Intake/Output from this shift: Total I/O In: 360 [P.O.:360] Out: 900 [Urine:900]  Labs: Recent Labs    12/18/16 1711 12/19/16 0352  WBC 16.2* 13.9*  HGB 14.1 12.8  HCT 41.6 37.8  PLT 185 165  CREATININE 0.85 0.78  MG  --  1.8  ALBUMIN 3.4*  --   PROT 7.1  --   AST 105*  --   ALT 180*  --   ALKPHOS 224*  --   BILITOT 1.6*  --    Estimated Creatinine Clearance: 83.2 mL/min (by C-G formula based on SCr of 0.78 mg/dL).  Assessment: Pharmacy consulted to assist with monitoring and replacing electrolytes.   Patient was hypokalemic with K = 2.7 on admission last night which improved to K = 3.2 with AM labs this morning. Patient is currently on maintenance fluids with KCl which will provide ~ 60 mEq IV KCl in a 24 hour period.  Mg = 1.8  Goal of Therapy: Electrolytes WNL  Plan:  Magnesium 2 g IV once Maintenance fluids with KCl ordered by MD.   Will recheck electrolytes with AM labs tomorrow.  Cindi CarbonMary M Corabelle Campos, PharmD Clinical Pharmacist 12/19/2016,5:39 PM

## 2016-12-19 NOTE — Progress Notes (Signed)
Initial Nutrition Assessment  DOCUMENTATION CODES:   Obesity unspecified  INTERVENTION:   Premier Protein BID, each supplement provides 160 kcal and 30 grams of protein.   Liberalize diet   NUTRITION DIAGNOSIS:   Inadequate oral intake related to acute illness as evidenced by meal completion < 50%.  GOAL:   Patient will meet greater than or equal to 90% of their needs  MONITOR:   PO intake, Supplement acceptance, Labs, Weight trends, I & O's  REASON FOR ASSESSMENT:   Malnutrition Screening Tool    ASSESSMENT:   56 y.o. female with a known history per below presenting with 5-day history of generalized weakness, fatigue, generalized body ache, developed worsening left-sided flank pain last 24 hours, in the emergency room patient was found to have left ureteral stone with mild hydronephrosis and sepsis  Met with pt in room today. Pt reports decreased appetite and oral intake for 5 days pta. Pt reports a metallic taste in her mouth that is deterring her from eating. Pt ate 25% of her breakfast today. Per chart, pt has lost 14lbs(7%) over the past 6 months; this is not significant for the time frame. Pt reports history of gastric lap band ~7 years ago. Pt reports that she is unable to eat large meals and she will occasionally vomit if she eats too much. RD will order supplements and liberalize diet. Pt with hypokalemia which is being supplemented.     Medications reviewed and include: heparin, hydrochlorothiazide, protonix, NaCl w/ KCl, ceftriaxone   Labs reviewed: K 3.2(L), Ca 8.5(L) Wbc- 13.9(H) cbgs- 125, 188 x 24 hrs  Nutrition-Focused physical exam completed. Findings are no fat depletion, moderate muscle depletions in clavicles, and no edema.   Diet Order:  Diet regular Room service appropriate? Yes; Fluid consistency: Thin  EDUCATION NEEDS:   Education needs have been addressed  Skin:  Reviewed RN Assessment  Last BM:  PTA  Height:   Ht Readings from Last 1  Encounters:  12/18/16 5' 6"  (1.676 m)    Weight:   Wt Readings from Last 1 Encounters:  12/18/16 177 lb 14.4 oz (80.7 kg)    Ideal Body Weight:  59 kg  BMI:  Body mass index is 28.71 kg/m.  Estimated Nutritional Needs:   Kcal:  1600-1800kcal/day   Protein:  80-89g/day   Fluid:  >1.6L/day   Koleen Distance MS, RD, LDN Pager #[phone] After Hours Pager: [phone]

## 2016-12-20 LAB — URINE CULTURE
CULTURE: NO GROWTH
SPECIAL REQUESTS: NORMAL

## 2016-12-20 LAB — CBC
HCT: 38.8 % (ref 35.0–47.0)
HEMOGLOBIN: 13.2 g/dL (ref 12.0–16.0)
MCH: 31.6 pg (ref 26.0–34.0)
MCHC: 34.1 g/dL (ref 32.0–36.0)
MCV: 92.7 fL (ref 80.0–100.0)
Platelets: 176 10*3/uL (ref 150–440)
RBC: 4.19 MIL/uL (ref 3.80–5.20)
RDW: 12.8 % (ref 11.5–14.5)
WBC: 14.7 10*3/uL — ABNORMAL HIGH (ref 3.6–11.0)

## 2016-12-20 LAB — BASIC METABOLIC PANEL
Anion gap: 7 (ref 5–15)
BUN: 15 mg/dL (ref 6–20)
CALCIUM: 8.3 mg/dL — AB (ref 8.9–10.3)
CHLORIDE: 105 mmol/L (ref 101–111)
CO2: 25 mmol/L (ref 22–32)
CREATININE: 0.59 mg/dL (ref 0.44–1.00)
GFR calc Af Amer: >60 mL/min (ref >60)
GFR calc non Af Amer: >60 mL/min (ref >60)
Glucose, Bld: 119 mg/dL — ABNORMAL HIGH (ref 65–99)
Potassium: 3.4 mmol/L — ABNORMAL LOW (ref 3.5–5.1)
SODIUM: 137 mmol/L (ref 135–145)

## 2016-12-20 LAB — HIV ANTIBODY (ROUTINE TESTING W REFLEX): HIV Screen 4th Generation wRfx: NONREACTIVE

## 2016-12-20 LAB — MAGNESIUM: MAGNESIUM: 1.9 mg/dL (ref 1.7–2.4)

## 2016-12-20 MED ORDER — POTASSIUM CHLORIDE CRYS ER 20 MEQ PO TBCR
40.0000 meq | EXTENDED_RELEASE_TABLET | Freq: Once | ORAL | Status: AC
  Administered 2016-12-20: 40 meq via ORAL
  Filled 2016-12-20: qty 2

## 2016-12-20 MED ORDER — PREMIER PROTEIN SHAKE: 11.0000 [oz_av] | Freq: Two times a day (BID) | ORAL | 0 refills | Status: DC

## 2016-12-20 MED ORDER — CEPHALEXIN 500 MG PO CAPS: 500.0000 mg | ORAL_CAPSULE | Freq: Three times a day (TID) | ORAL | 0 refills | Status: DC

## 2016-12-20 NOTE — Progress Notes (Signed)
Pt. Discharged home with husband, VSS, no new complaints at this time.

## 2016-12-20 NOTE — Progress Notes (Signed)
MEDICATION RELATED CONSULT NOTE - INITIAL   Pharmacy Consult for electrolyte monitoring Indication: hypokalemia  Allergies  Allergen Reactions  . Lexapro [Escitalopram Oxalate] Other (See Comments)    Worsening mood  . Lodine [Etodolac] Other (See Comments)    GERD- tolerates celebrex  . Naproxen Other (See Comments)    GERD- tolerates celebrex   Patient Measurements: Height: 5\' 6"  (167.6 cm) Weight: 177 lb 14.4 oz (80.7 kg) IBW/kg (Calculated) : 59.3  Vital Signs: Temp: 98.4 F (36.9 C) (12/19 0737) Temp Source: Oral (12/19 0737) BP: 125/70 (12/19 0737) Pulse Rate: 71 (12/19 0737) Intake/Output from previous day: 12/18 0701 - 12/19 0700 In: 1100 [P.O.:600; I.V.:500] Out: 2500 [Urine:2500] Intake/Output from this shift: Total I/O In: -  Out: 250 [Urine:250]  Labs: Recent Labs    12/18/16 1711 12/19/16 0352 12/20/16 0413  WBC 16.2* 13.9* 14.7*  HGB 14.1 12.8 13.2  HCT 41.6 37.8 38.8  PLT 185 165 176  CREATININE 0.85 0.78 0.59  MG  --  1.8 1.9  ALBUMIN 3.4*  --   --   PROT 7.1  --   --   AST 105*  --   --   ALT 180*  --   --   ALKPHOS 224*  --   --   BILITOT 1.6*  --   --    Estimated Creatinine Clearance: 83.2 mL/min (by C-G formula based on SCr of 0.59 mg/dL).  Assessment: Pharmacy consulted to assist with monitoring and replacing electrolytes.   K=3.4  Goal of Therapy: Electrolytes WNL  Plan:  KCl 40 meq po once. Patient has orders for d/c home.   Valentina Guhristy, Marlina Cataldi D, PharmD Clinical Pharmacist 12/20/2016,11:50 AM

## 2016-12-20 NOTE — Plan of Care (Signed)
  Clinical Measurements: Ability to maintain clinical measurements within normal limits will improve 12/20/2016 1106 - Adequate for Discharge by Jerene DillingJackson, Lassie Demorest D, RN  Potassium level up to 3.4 from 3.2 yesterday, and 2.9 upon admission, pt. Also given oral potassium supplement this a.m.

## 2016-12-20 NOTE — Plan of Care (Signed)
  Progressing Pain Managment: General experience of comfort will improve 12/20/2016 0021 - Progressing by Stefan Churchogers, Johneisha Broaden M, RN

## 2016-12-20 NOTE — Discharge Summary (Signed)
[facility] [place] [facility]   PATIENT NAME: Tiffany Campos    MR#:  [id number]  DATE OF BIRTH:  08/22/1960  DATE OF ADMISSION:  12/18/2016 ADMITTING PHYSICIAN: Bertrum SolMontell D Salary, MD  DATE OF DISCHARGE: 12/20/2016  PRIMARY CARE PHYSICIAN: Joaquim Namuncan, Graham S, MD    ADMISSION DIAGNOSIS:  Pyelonephritis [N12] Kidney stone on left side [N20.0] Sepsis, due to unspecified organism [facility](HCC) [A41.9]  DISCHARGE DIAGNOSIS:  Active Problems:   Sepsis (HCC)   SECONDARY DIAGNOSIS:   Past Medical History:  Diagnosis Date  . Anxiety   . Arthritis   . Diverticulosis   . GERD (gastroesophageal reflux disease)   . Hyperlipidemia   . Hypertension   . Joint pain   . Kidney stones   . 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 Stress incontinence     HOSPITAL COURSE:    56 year old female with history of anxiety who presented with left flank pain and fever and chills.   1. Sepsis: Patient presented with fever and leukocytosis due to urinary tract infection/left obstructing distal ureteral stone  Sepsis has resolved   2. Left obstructing distal ureteral st [place] with hydronephrosis: Patient is status post cystoscopy postoperative day #2 and left ureteral stent placement. She is without pain today. She has urinated without Foley catheter and denies hematuria. She'll follow-up with urology She will need to follow-up with urology for definitive treatment for ureteral stone.  3. UTI: Patient will be transitioned to Keflex.  4. Essential hypertension: Continue HCTZ  5. Anxiety: Continue Xanax and Cymbalta  6. Hypokalemia: This is repleted  DISCHARGE CONDITIONS AND DIET:   Stable for discharge and regular diet   CONSULTS OBTAINED:  Treatment Team:  Jerilee FieldEskridge, Matthew, MD  DRUG ALLERGIES:   Allergies  Allergen Reactions  . Lexapro [Escitalopram Oxalate] Other (See Comments)    Worsening mood  . Lodine [Etodolac] Other  (See Comments)    GERD- tolerates celebrex  . Naproxen Other (See Comments)    GERD- tolerates celebrex    DISCHARGE MEDICATIONS:   Allergies as of 12/20/2016      Reactions   Lexapro [escitalopram Oxalate] Other (See Comments)   Worsening mood   Lodine [etodolac] Other (See Comments)   GERD- tolerates celebrex   Naproxen Other (See Comments)   GERD- tolerates celebrex      Medication List    TAKE these medications   ALPRAZolam 1 MG tablet Commonly known as:  XANAX TAKE 1 TABLET BY MOUTH TWICE A DAY AS NEEDED FOR ANXIETY   celecoxib 200 MG capsule Commonly known as:  CELEBREX TAKE 1 CAPSULE (200 MG TOTAL) BY MOUTH DAILY.   cephALEXin 500 MG capsule Commonly known as:  KEFLEX Take 1 capsule (500 mg total) by mouth 3 (three) times daily for 7 days.   cyanocobalamin 1000 MCG/ML injection Commonly known as:  (VITAMIN B-12) INJECT 1000MCG (1 MILLILITER) EVERY 14 DAYS.   DULoxetine 20 MG capsule Commonly known as:  CYMBALTA Take 1 capsule (20 mg total) by mouth daily.   hydrochlorothiazide 25 MG tablet Commonly known as:  HYDRODIURIL TAKE 1 TABLET (25 MG TOTAL) BY MOUTH DAILY.   pantoprazole 40 MG tablet Commonly known as:  PROTONIX TAKE 1 TABLET (40 MG TOTAL) BY MOUTH DAILY.   protein supplement shake Liqd Commonly known as:  PREMIER PROTEIN Take 325 mLs (11 oz total) by mouth 2 (two) times daily between meals for 15 days.   SYRINGE  3CC/25GX1" 25G X 1" 3 ML Misc Use to inject 1 mL of Vitamin B12 into the muscle every 30 days.   traMADol 50 MG tablet Commonly known as:  ULTRAM TAKE 1 TABLET BY MOUTH EVERY DAY AS NEEDED         Today   CHIEF COMPLAINT:  Patient doing well this morning. Denies hematuria and denies pain VITAL SIGNS:  Blood pressure 125/70, pulse 71, temperature 98.4 F (36.9 C), temperature source Oral, resp. rate 18, height 5\' 6"  (1.676 m), weight 80.7 kg (177 lb 14.4 oz), SpO2 96 %.   REVIEW OF SYSTEMS:  Review of Systems   Constitutional: Negative.  Negative for chills, fever and malaise/fatigue.  HENT: Negative.  Negative for ear discharge, ear pain, hearing loss, nosebleeds and sore throat.   Eyes: Negative.  Negative for blurred vision and pain.  Respiratory: Negative.  Negative for cough, hemoptysis, shortness of breath and wheezing.   Cardiovascular: Negative.  Negative for chest pain, palpitations and leg swelling.  Gastrointestinal: Negative.  Negative for abdominal pain, blood in stool, diarrhea, nausea and vomiting.  Genitourinary: Negative.  Negative for dysuria.  Musculoskeletal: Negative.  Negative for back pain.  Skin: Negative.   Neurological: Negative for dizziness, tremors, speech change, focal weakness, seizures and headaches.  Endo/Heme/Allergies: Negative.  Does not bruise/bleed easily.  Psychiatric/Behavioral: Negative.  Negative for depression, hallucinations and suicidal ideas.     PHYSICAL EXAMINATION:  GENERAL:  56 y.o.-year-old patient lying in the bed with no acute distress.  NECK:  Supple, no jugular venous distention. No thyroid enlargement, no tenderness.  LUNGS: Normal breath sounds bilaterally, no wheezing, rales,rhonchi  No use of accessory muscles of respiration.  CARDIOVASCULAR: S1, S2 normal. No murmurs, rubs, or gallops.  ABDOMEN: Soft, non-tender, non-distended. Bowel sounds present. No organomegaly or mass.  EXTREMITIES: No pedal edema, cyanosis, or clubbing.  PSYCHIATRIC: The patient is alert and oriented x 3.  SKIN: No obvious rash, lesion, or ulcer.   DATA REVIEW:   CBC Recent Labs  Lab 12/20/16 0413  WBC 14.7*  HGB 13.2  HCT 38.8  PLT 176    Chemistries  Recent Labs  Lab 12/18/16 1711  12/20/16 0413  NA 132*   < > 137  K 2.7*   < > 3.4*  CL 96*   < > 105  CO2 25   < > 25  GLUCOSE 125*   < > 119*  BUN 19   < > 15  CREATININE 0.85   < > 0.59  CALCIUM 8.8*   < > 8.3*  MG  --    < > 1.9  AST 105*  --   --   ALT 180*  --   --   ALKPHOS 224*  --    --   BILITOT 1.6*  --   --    < > = values in this interval not displayed.    Cardiac Enzymes No results for input(s): TROPONINI in the last 168 hours.  Microbiology Results  @MICRORSLT48 @  RADIOLOGY:  Ct Renal Stone Study  Result Date: 12/18/2016 CLINICAL DATA:  Fever and body aches for 4 days. Left flank and back pain. Mild dysuria. EXAM: CT ABDOMEN AND PELVIS WITHOUT CONTRAST TECHNIQUE: Multidetector CT imaging of the abdomen and pelvis was performed following the standard protocol without IV contrast. COMPARISON:  None. FINDINGS: Lower chest:  Subsegmental atelectasis noted in the lingula. Hepatobiliary: No focal abnormality in the liver on this study without intravenous contrast. Gallbladder surgically absent. No  intrahepatic or extrahepatic biliary dilation. Pancreas: No focal mass lesion. No dilatation of the main duct. No intraparenchymal cyst. No peripancreatic edema. Spleen: No splenomegaly. No focal mass lesion. Adrenals/Urinary Tract: No adrenal nodule or mass. Right kidney unremarkable. No right ureteral stone. Several tiny (1-2 mm) nonobstructing stones are seen in the upper pole the left kidney 2 mm nonobstructing stone identified interpolar left kidney. 2 x 4 x 5 mm stone is identified in the distal left ureter 1-2 cm proximal to the UVJ. No bladder stones. Stomach/Bowel: A lap band is identified at the proximal stomach. There is a tiny associated hiatal hernia. Stomach otherwise unremarkable. Duodenum is normally positioned as is the ligament of Treitz. No small bowel wall thickening. No small bowel dilatation. The terminal ileum is normal. Cecum high in the right upper quadrant The appendix is normal. Diverticular changes are noted in the left colon without evidence of diverticulitis. Vascular/Lymphatic: No abdominal aortic aneurysm. There is no gastrohepatic or hepatoduodenal ligament lymphadenopathy. No intraperitoneal or retroperitoneal lymphadenopathy. No pelvic sidewall  lymphadenopathy Reproductive: Uterus surgically absent.  There is no adnexal mass. Other: No intraperitoneal free fluid. Musculoskeletal: Bone windows reveal no worrisome lytic or sclerotic osseous lesions. IMPRESSION: 1. 2 x 4 x 5 mm stone distal left ureter about 1-2 cm proximal to the UVJ. This causes mild left hydroureteronephrosis. 2. Multiple tiny nonobstructing left renal stones. 3. Status post lap band surgery with tiny hiatal hernia evident. Electronically Signed   By: Kennith Center M.D.   On: 12/18/2016 18:00      Allergies as of 12/20/2016      Reactions   Lexapro [escitalopram Oxalate] Other (See Comments)   Worsening mood   Lodine [etodolac] Other (See Comments)   GERD- tolerates celebrex   Naproxen Other (See Comments)   GERD- tolerates celebrex      Medication List    TAKE these medications   ALPRAZolam 1 MG tablet Commonly known as:  XANAX TAKE 1 TABLET BY MOUTH TWICE A DAY AS NEEDED FOR ANXIETY   celecoxib 200 MG capsule Commonly known as:  CELEBREX TAKE 1 CAPSULE (200 MG TOTAL) BY MOUTH DAILY.   cephALEXin 500 MG capsule Commonly known as:  KEFLEX Take 1 capsule (500 mg total) by mouth 3 (three) times daily for 7 days.   cyanocobalamin 1000 MCG/ML injection Commonly known as:  (VITAMIN B-12) INJECT (1 MILLILITER) EVERY 14 DAYS.   DULoxetine 20 MG capsule Commonly known as:  CYMBALTA Take 1 capsule (20 mg total) by mouth daily.   hydrochlorothiazide 25 MG tablet Commonly known as:  HYDRODIURIL TAKE 1 TABLET (25 MG TOTAL) BY MOUTH DAILY.   pantoprazole 40 MG tablet Commonly known as:  PROTONIX TAKE 1 TABLET (40 MG TOTAL) BY MOUTH DAILY.   protein supplement shake Liqd Commonly known as:  PREMIER PROTEIN Take 325 mLs (11 oz total) by mouth 2 (two) times daily between meals for 15 days.   SYRINGE 3CC/25GX1" 25G X 1" 3 ML Misc Use to inject 1 mL of Vitamin B12 into the muscle every 30 days.   traMADol 50 MG tablet Commonly known as:   ULTRAM TAKE 1 TABLET BY MOUTH EVERY DAY AS NEEDED          Management plans discussed with the patient and she is in agreement. Stable for discharge home  Patient should follow up with urology  CODE STATUS:     Code Status Orders  (From admission, onward)        Start  Ordered   12/18/16 2316  Full code  Continuous     12/18/16 2315    Code Status History    Date Active Date Inactive Code Status Order ID Comments User Context   This patient has a current code status but no historical code status.      TOTAL TIME TAKING CARE OF THIS PATIENT: 38 minutes.    Note: This dictation was prepared with Dragon dictation along with smaller phrase technology. Any transcriptional errors that result from this process are unintentional.  Calynn Ferrero M.D on 12/20/2016 at 8:41 AM  Between 7am to 6pm - Pager - [phone] After 6pm go to [email or web address] - Social research officer, governmentpassword [email or web address] [facility]  [facility] [place] [facility]  Office  [phone]  CC: Primary care physician; Joaquim Namuncan, Graham S, MD

## 2016-12-22 ENCOUNTER — Ambulatory Visit: Payer: BLUE CROSS/BLUE SHIELD | Admitting: Urology

## 2016-12-22 ENCOUNTER — Other Ambulatory Visit: Payer: Self-pay | Admitting: Radiology

## 2016-12-22 ENCOUNTER — Encounter
Admission: RE | Admit: 2016-12-22 | Discharge: 2016-12-22 | Disposition: A | Discharge status: Home / Self Care | Payer: BLUE CROSS/BLUE SHIELD | Source: Ambulatory Visit | Attending: Urology | Admitting: Urology

## 2016-12-22 ENCOUNTER — Other Ambulatory Visit: Payer: Self-pay

## 2016-12-22 ENCOUNTER — Encounter: Payer: Self-pay | Admitting: Urology

## 2016-12-22 ENCOUNTER — Ambulatory Visit: Payer: Self-pay

## 2016-12-22 VITALS — BP 111/74 | HR 74 | Ht 66.0 in | Wt 173.0 lb

## 2016-12-22 DIAGNOSIS — N201 Calculus of ureter: Secondary | ICD-10-CM | POA: Diagnosis not present

## 2016-12-22 LAB — URINALYSIS, COMPLETE
Bilirubin, UA: NEGATIVE
GLUCOSE, UA: NEGATIVE
NITRITE UA: NEGATIVE
Specific Gravity, UA: 1.015 (ref 1.005–1.030)
Urobilinogen, Ur: 0.2 mg/dL (ref 0.2–1.0)
pH, UA: 6 (ref 5.0–7.5)

## 2016-12-22 LAB — MICROSCOPIC EXAMINATION

## 2016-12-22 MED ORDER — FLUCONAZOLE 150 MG PO TABS: 150.0000 mg | ORAL_TABLET | Freq: Once | ORAL | 0 refills | Status: AC

## 2016-12-22 MED ORDER — CEPHALEXIN 500 MG PO CAPS: 500.0000 mg | ORAL_CAPSULE | Freq: Three times a day (TID) | ORAL | 0 refills | Status: AC

## 2016-12-22 MED ORDER — HYDROCODONE-ACETAMINOPHEN 5-325 MG PO TABS
1.0000 | ORAL_TABLET | ORAL | 0 refills | Status: DC | PRN
Start: 2016-12-22 — End: 2017-04-02

## 2016-12-22 NOTE — H&P (View-Only) (Signed)
12/22/2016 1:34 PM   Tiffany Campos Feb 24, 1960 [id number]  Referring provider: Joaquim Nam, MD [address] [place], [place] [address]  Chief Complaint  Patient presents with  . Nephrolithiasis    pre-op    HPI: 56 year old female presents for a preop visit.  She presented to the ED on 12/18/2016 with fever to 103 degrees and had a 5 mm left distal ureteral calculus.  She underwent left ureteral stent placement by Dr. Mena Goes on 12/18/2016 and was discharged on 12/20/2016.  Urine and blood cultures were negative.  She has mild to moderate stent irritative symptoms.  She does have some tiredness and fatigue but denies fever, chills.  She has occasional spasmodic pain in the left flank.  She is scheduled for ureteroscopic stone removal on 12/29/2016.   PMH: Past Medical History:  Diagnosis Date  . Anxiety   . Arthritis   . Diverticulosis   . GERD (gastroesophageal reflux disease)   . Hyperlipidemia   . Hypertension   . Joint pain   . Campos stones   . 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 Stress incontinence     Surgical History: Past Surgical History:  Procedure Laterality Date  . ABDOMINAL HYSTERECTOMY    . CHOLECYSTECTOMY    . CYSTOSCOPY W/ URETERAL STENT PLACEMENT Left 12/18/2016   Procedure: CYSTOSCOPY WITH RETROGRADE PYELOGRAM/URETERAL STENT PLACEMENT;  Surgeon: Jerilee Field, MD;  Location: [facility] ORS;  Service: Urology;  Laterality: Left;  . ECTOPIC PREGNANCY SURGERY    . LAPAROSCOPIC GASTRIC BANDING  08/03/2009  . ROTATOR CUFF REPAIR  05-25-11   right, with distal clavicle resection  . SHOULDER ARTHROSCOPY    . tmj    . TONSILLECTOMY AND ADENOIDECTOMY      Home Medications:  Allergies as of 12/22/2016      Reactions   Lexapro [escitalopram Oxalate] Other (See Comments)   Worsening mood   Lodine [etodolac] Other (See Comments)   GERD- tolerates celebrex   Naproxen Other (See Comments)   GERD-  tolerates celebrex      Medication List        Accurate as of 12/22/16  1:34 PM. Always use your most recent med list.          ALPRAZolam 1 MG tablet Commonly known as:  XANAX TAKE 1 TABLET BY MOUTH TWICE A DAY AS NEEDED FOR ANXIETY   celecoxib 200 MG capsule Commonly known as:  CELEBREX TAKE 1 CAPSULE (200 MG TOTAL) BY MOUTH DAILY.   cephALEXin 500 MG capsule Commonly known as:  KEFLEX Take 1 capsule (500 mg total) by mouth 3 (three) times daily for 7 days.   cyanocobalamin 1000 MCG/ML injection Commonly known as:  (VITAMIN B-12) INJECT (1 MILLILITER) EVERY 14 DAYS.   DULoxetine 20 MG capsule Commonly known as:  CYMBALTA Take 1 capsule (20 mg total) by mouth daily.   hydrochlorothiazide 25 MG tablet Commonly known as:  HYDRODIURIL TAKE 1 TABLET (25 MG TOTAL) BY MOUTH DAILY.   pantoprazole 40 MG tablet Commonly known as:  PROTONIX TAKE 1 TABLET (40 MG TOTAL) BY MOUTH DAILY.   SYRINGE 3CC/25GX1" 25G X 1" 3 ML Misc Use to inject 1 mL of Vitamin B12 into the muscle every 30 days.   traMADol 50 MG tablet Commonly known as:  ULTRAM TAKE 1 TABLET BY MOUTH EVERY DAY AS NEEDED       Allergies:  Allergies  Allergen Reactions  .  Lexapro [Escitalopram Oxalate] Other (See Comments)    Worsening mood  . Lodine [Etodolac] Other (See Comments)    GERD- tolerates celebrex  . Naproxen Other (See Comments)    GERD- tolerates celebrex    Family History: Family History  Problem Relation Age of Onset  . Peripheral vascular disease Father   . GER disease Father   . Diabetes Mother   . Hypertension Mother   . Arthritis Mother   . GER disease Mother   . Cancer Mother        carcinoid tumor  . Colon cancer Maternal Uncle        possible colon cancer dx  . Breast cancer Neg Hx     Social History:  reports that  has never smoked. she has never used smokeless tobacco. She reports that she drinks about 8.4 oz of alcohol per week. She reports that she does not  use drugs.  ROS: UROLOGY Frequent Urination?: Yes Hard to postpone urination?: Yes Burning/pain with urination?: Yes Get up at night to urinate?: Yes Leakage of urine?: No Urine stream starts and stops?: No Trouble starting stream?: No Do you have to strain to urinate?: No Blood in urine?: No Urinary tract infection?: No Sexually transmitted disease?: No Injury to kidneys or bladder?: No Painful intercourse?: No Weak stream?: No Currently pregnant?: No Vaginal bleeding?: No Last menstrual period?: n  Gastrointestinal Nausea?: No Vomiting?: No Indigestion/heartburn?: No Diarrhea?: No Constipation?: No  Constitutional Fever: No Night sweats?: No Weight loss?: Yes Fatigue?: No  Skin Skin rash/lesions?: No Itching?: No  Eyes Blurred vision?: No Double vision?: No  Ears/Nose/Throat Sore throat?: No Sinus problems?: No  Hematologic/Lymphatic Swollen glands?: No Easy bruising?: No  Cardiovascular Leg swelling?: No Chest pain?: No  Respiratory Cough?: Yes Shortness of breath?: No  Endocrine Excessive thirst?: No  Musculoskeletal Back pain?: Yes Joint pain?: Yes  Neurological Headaches?: No Dizziness?: No  Psychologic Depression?: No Anxiety?: Yes  Physical Exam: BP 111/74   Pulse 74   Ht 5\' 6"  (1.676 m)   Wt 173 lb (78.5 kg)   BMI 27.92 kg/m   Constitutional:  Alert and oriented, No acute distress. HEENT: [place] AT, moist mucus membranes.  Trachea midline, no masses. Cardiovascular: No clubbing, cyanosis, or edema. CV:RRR Respiratory: Normal respiratory effort, no increased work of breathing. Lungs clear GI: Abdomen is soft, nontender, nondistended, no abdominal masses GU: No CVA tenderness.   Skin: No rashes, bruises or suspicious lesions. Lymph: No cervical or inguinal adenopathy. Neurologic: Grossly intact, no focal deficits, moving all 4 extremities. Psychiatric: Normal mood and affect.  Laboratory Data: Lab Results  Component  Value Date   WBC 14.7 (H) 12/20/2016   HGB 13.2 12/20/2016   HCT 38.8 12/20/2016   MCV 92.7 12/20/2016   PLT 176 12/20/2016    Lab Results  Component Value Date   CREATININE 0.59 12/20/2016      Pertinent Imaging: CT reviewed Results for orders placed during the hospital encounter of 12/18/16  CT Renal Stone Study   Narrative CLINICAL DATA:  Fever and body aches for 4 days. Left flank and back pain. Mild dysuria.  EXAM: CT ABDOMEN AND PELVIS WITHOUT CONTRAST  TECHNIQUE: Multidetector CT imaging of the abdomen and pelvis was performed following the standard protocol without IV contrast.  COMPARISON:  None.  FINDINGS: Lower chest:  Subsegmental atelectasis noted in the lingula.  Hepatobiliary: No focal abnormality in the liver on this study without intravenous contrast. Gallbladder surgically absent. No intrahepatic or extrahepatic  biliary dilation.  Pancreas: No focal mass lesion. No dilatation of the main duct. No intraparenchymal cyst. No peripancreatic edema.  Spleen: No splenomegaly. No focal mass lesion.  Adrenals/Urinary Tract: No adrenal nodule or mass. Right Campos unremarkable. No right ureteral stone. Several tiny (1-2 mm) nonobstructing stones are seen in the upper pole the left Campos 2 mm nonobstructing stone identified interpolar left Campos. 2 x 4 x 5 mm stone is identified in the distal left ureter 1-2 cm proximal to the UVJ. No bladder stones.  Stomach/Bowel: A lap band is identified at the proximal stomach. There is a tiny associated hiatal hernia. Stomach otherwise unremarkable. Duodenum is normally positioned as is the ligament of Treitz. No small bowel wall thickening. No small bowel dilatation. The terminal ileum is normal. Cecum high in the right upper quadrant The appendix is normal. Diverticular changes are noted in the left colon without evidence of diverticulitis.  Vascular/Lymphatic: No abdominal aortic aneurysm. There is  no gastrohepatic or hepatoduodenal ligament lymphadenopathy. No intraperitoneal or retroperitoneal lymphadenopathy. No pelvic sidewall lymphadenopathy  Reproductive: Uterus surgically absent.  There is no adnexal mass.  Other: No intraperitoneal free fluid.  Musculoskeletal: Bone windows reveal no worrisome lytic or sclerotic osseous lesions.  IMPRESSION: 1. 2 x 4 x 5 mm stone distal left ureter about 1-2 cm proximal to the UVJ. This causes mild left hydroureteronephrosis. 2. Multiple tiny nonobstructing left renal stones. 3. Status post lap band surgery with tiny hiatal hernia evident.   Electronically Signed   By: Kennith CenterEric  Mansell M.D.   On: 12/18/2016 18:00     Assessment & Plan:    1. Ureteral calculus Status post left ureteral stent placement for suspected obstructing stone with sepsis.  She is scheduled for definitive stone treatment on 12/28.  The indications and nature of the planned procedure were discussed as well as the potential  benefits and expected outcome.  Alternatives have been discussed in detail. The most common complications and side effects were discussed including but not limited to infection/sepsis; blood loss; damage to urethra, bladder, ureter, need for multiple surgeries; need for prolonged stent placement as well as general anesthesia risks.  All of her questions were answered and she desires to proceed.  At the time of our visit he she did not appear to be distracted or in pain.  She declined pain medication at the time of hospital discharge however states her intermittent spasmodic pain is bothersome and Rx hydrocodone was sent to her pharmacy.  She also requested an Rx Diflucan to have on hand as she is prone to yeast infections.  Will keep her on her oral antibiotic until her surgery date.  - Urinalysis, Complete - CULTURE, URINE COMPREHENSIVE    Riki AltesScott C Oluwademilade Kellett, MD  [facility] [address], [address] [place],  [place] [id number] [phone]

## 2016-12-22 NOTE — Progress Notes (Signed)
12/22/2016 1:34 PM   Tiffany Campos Feb 24, 1960 [id number]  Referring provider: Joaquim Nam, MD [address] [place], [place] [address]  Chief Complaint  Patient presents with  . Nephrolithiasis    pre-op    HPI: 56 year old female presents for a preop visit.  She presented to the ED on 12/18/2016 with fever to 103 degrees and had a 5 mm left distal ureteral calculus.  She underwent left ureteral stent placement by Dr. Mena Goes on 12/18/2016 and was discharged on 12/20/2016.  Urine and blood cultures were negative.  She has mild to moderate stent irritative symptoms.  She does have some tiredness and fatigue but denies fever, chills.  She has occasional spasmodic pain in the left flank.  She is scheduled for ureteroscopic stone removal on 12/29/2016.   PMH: Past Medical History:  Diagnosis Date  . Anxiety   . Arthritis   . Diverticulosis   . GERD (gastroesophageal reflux disease)   . Hyperlipidemia   . Hypertension   . Joint pain   . Campos stones   . 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 Stress incontinence     Surgical History: Past Surgical History:  Procedure Laterality Date  . ABDOMINAL HYSTERECTOMY    . CHOLECYSTECTOMY    . CYSTOSCOPY W/ URETERAL STENT PLACEMENT Left 12/18/2016   Procedure: CYSTOSCOPY WITH RETROGRADE PYELOGRAM/URETERAL STENT PLACEMENT;  Surgeon: Jerilee Field, MD;  Location: [facility] ORS;  Service: Urology;  Laterality: Left;  . ECTOPIC PREGNANCY SURGERY    . LAPAROSCOPIC GASTRIC BANDING  08/03/2009  . ROTATOR CUFF REPAIR  05-25-11   right, with distal clavicle resection  . SHOULDER ARTHROSCOPY    . tmj    . TONSILLECTOMY AND ADENOIDECTOMY      Home Medications:  Allergies as of 12/22/2016      Reactions   Lexapro [escitalopram Oxalate] Other (See Comments)   Worsening mood   Lodine [etodolac] Other (See Comments)   GERD- tolerates celebrex   Naproxen Other (See Comments)   GERD-  tolerates celebrex      Medication List        Accurate as of 12/22/16  1:34 PM. Always use your most recent med list.          ALPRAZolam 1 MG tablet Commonly known as:  XANAX TAKE 1 TABLET BY MOUTH TWICE A DAY AS NEEDED FOR ANXIETY   celecoxib 200 MG capsule Commonly known as:  CELEBREX TAKE 1 CAPSULE (200 MG TOTAL) BY MOUTH DAILY.   cephALEXin 500 MG capsule Commonly known as:  KEFLEX Take 1 capsule (500 mg total) by mouth 3 (three) times daily for 7 days.   cyanocobalamin 1000 MCG/ML injection Commonly known as:  (VITAMIN B-12) INJECT (1 MILLILITER) EVERY 14 DAYS.   DULoxetine 20 MG capsule Commonly known as:  CYMBALTA Take 1 capsule (20 mg total) by mouth daily.   hydrochlorothiazide 25 MG tablet Commonly known as:  HYDRODIURIL TAKE 1 TABLET (25 MG TOTAL) BY MOUTH DAILY.   pantoprazole 40 MG tablet Commonly known as:  PROTONIX TAKE 1 TABLET (40 MG TOTAL) BY MOUTH DAILY.   SYRINGE 3CC/25GX1" 25G X 1" 3 ML Misc Use to inject 1 mL of Vitamin B12 into the muscle every 30 days.   traMADol 50 MG tablet Commonly known as:  ULTRAM TAKE 1 TABLET BY MOUTH EVERY DAY AS NEEDED       Allergies:  Allergies  Allergen Reactions  .  Lexapro [Escitalopram Oxalate] Other (See Comments)    Worsening mood  . Lodine [Etodolac] Other (See Comments)    GERD- tolerates celebrex  . Naproxen Other (See Comments)    GERD- tolerates celebrex    Family History: Family History  Problem Relation Age of Onset  . Peripheral vascular disease Father   . GER disease Father   . Diabetes Mother   . Hypertension Mother   . Arthritis Mother   . GER disease Mother   . Cancer Mother        carcinoid tumor  . Colon cancer Maternal Uncle        possible colon cancer dx  . Breast cancer Neg Hx     Social History:  reports that  has never smoked. she has never used smokeless tobacco. She reports that she drinks about 8.4 oz of alcohol per week. She reports that she does not  use drugs.  ROS: UROLOGY Frequent Urination?: Yes Hard to postpone urination?: Yes Burning/pain with urination?: Yes Get up at night to urinate?: Yes Leakage of urine?: No Urine stream starts and stops?: No Trouble starting stream?: No Do you have to strain to urinate?: No Blood in urine?: No Urinary tract infection?: No Sexually transmitted disease?: No Injury to kidneys or bladder?: No Painful intercourse?: No Weak stream?: No Currently pregnant?: No Vaginal bleeding?: No Last menstrual period?: n  Gastrointestinal Nausea?: No Vomiting?: No Indigestion/heartburn?: No Diarrhea?: No Constipation?: No  Constitutional Fever: No Night sweats?: No Weight loss?: Yes Fatigue?: No  Skin Skin rash/lesions?: No Itching?: No  Eyes Blurred vision?: No Double vision?: No  Ears/Nose/Throat Sore throat?: No Sinus problems?: No  Hematologic/Lymphatic Swollen glands?: No Easy bruising?: No  Cardiovascular Leg swelling?: No Chest pain?: No  Respiratory Cough?: Yes Shortness of breath?: No  Endocrine Excessive thirst?: No  Musculoskeletal Back pain?: Yes Joint pain?: Yes  Neurological Headaches?: No Dizziness?: No  Psychologic Depression?: No Anxiety?: Yes  Physical Exam: BP 111/74   Pulse 74   Ht 5\' 6"  (1.676 m)   Wt 173 lb (78.5 kg)   BMI 27.92 kg/m   Constitutional:  Alert and oriented, No acute distress. HEENT: [place] AT, moist mucus membranes.  Trachea midline, no masses. Cardiovascular: No clubbing, cyanosis, or edema. CV:RRR Respiratory: Normal respiratory effort, no increased work of breathing. Lungs clear GI: Abdomen is soft, nontender, nondistended, no abdominal masses GU: No CVA tenderness.   Skin: No rashes, bruises or suspicious lesions. Lymph: No cervical or inguinal adenopathy. Neurologic: Grossly intact, no focal deficits, moving all 4 extremities. Psychiatric: Normal mood and affect.  Laboratory Data: Lab Results  Component  Value Date   WBC 14.7 (H) 12/20/2016   HGB 13.2 12/20/2016   HCT 38.8 12/20/2016   MCV 92.7 12/20/2016   PLT 176 12/20/2016    Lab Results  Component Value Date   CREATININE 0.59 12/20/2016      Pertinent Imaging: CT reviewed Results for orders placed during the hospital encounter of 12/18/16  CT Renal Stone Study   Narrative CLINICAL DATA:  Fever and body aches for 4 days. Left flank and back pain. Mild dysuria.  EXAM: CT ABDOMEN AND PELVIS WITHOUT CONTRAST  TECHNIQUE: Multidetector CT imaging of the abdomen and pelvis was performed following the standard protocol without IV contrast.  COMPARISON:  None.  FINDINGS: Lower chest:  Subsegmental atelectasis noted in the lingula.  Hepatobiliary: No focal abnormality in the liver on this study without intravenous contrast. Gallbladder surgically absent. No intrahepatic or extrahepatic  biliary dilation.  Pancreas: No focal mass lesion. No dilatation of the main duct. No intraparenchymal cyst. No peripancreatic edema.  Spleen: No splenomegaly. No focal mass lesion.  Adrenals/Urinary Tract: No adrenal nodule or mass. Right Campos unremarkable. No right ureteral stone. Several tiny (1-2 mm) nonobstructing stones are seen in the upper pole the left Campos 2 mm nonobstructing stone identified interpolar left Campos. 2 x 4 x 5 mm stone is identified in the distal left ureter 1-2 cm proximal to the UVJ. No bladder stones.  Stomach/Bowel: A lap band is identified at the proximal stomach. There is a tiny associated hiatal hernia. Stomach otherwise unremarkable. Duodenum is normally positioned as is the ligament of Treitz. No small bowel wall thickening. No small bowel dilatation. The terminal ileum is normal. Cecum high in the right upper quadrant The appendix is normal. Diverticular changes are noted in the left colon without evidence of diverticulitis.  Vascular/Lymphatic: No abdominal aortic aneurysm. There is  no gastrohepatic or hepatoduodenal ligament lymphadenopathy. No intraperitoneal or retroperitoneal lymphadenopathy. No pelvic sidewall lymphadenopathy  Reproductive: Uterus surgically absent.  There is no adnexal mass.  Other: No intraperitoneal free fluid.  Musculoskeletal: Bone windows reveal no worrisome lytic or sclerotic osseous lesions.  IMPRESSION: 1. 2 x 4 x 5 mm stone distal left ureter about 1-2 cm proximal to the UVJ. This causes mild left hydroureteronephrosis. 2. Multiple tiny nonobstructing left renal stones. 3. Status post lap band surgery with tiny hiatal hernia evident.   Electronically Signed   By: Kennith CenterEric  Mansell M.D.   On: 12/18/2016 18:00     Assessment & Plan:    1. Ureteral calculus Status post left ureteral stent placement for suspected obstructing stone with sepsis.  She is scheduled for definitive stone treatment on 12/28.  The indications and nature of the planned procedure were discussed as well as the potential  benefits and expected outcome.  Alternatives have been discussed in detail. The most common complications and side effects were discussed including but not limited to infection/sepsis; blood loss; damage to urethra, bladder, ureter, need for multiple surgeries; need for prolonged stent placement as well as general anesthesia risks.  All of her questions were answered and she desires to proceed.  At the time of our visit he she did not appear to be distracted or in pain.  She declined pain medication at the time of hospital discharge however states her intermittent spasmodic pain is bothersome and Rx hydrocodone was sent to her pharmacy.  She also requested an Rx Diflucan to have on hand as she is prone to yeast infections.  Will keep her on her oral antibiotic until her surgery date.  - Urinalysis, Complete - CULTURE, URINE COMPREHENSIVE    Riki AltesScott C Stoioff, MD  [facility] [address], [address] [place],  [place] [id number] [phone]

## 2016-12-22 NOTE — Patient Instructions (Signed)
Your procedure is scheduled on: December 29, 2016 FRIDAY Report to Same Day Surgery on the 2nd floor in the [facility]. To find out your arrival time, please call [phone] between 1PM - 3PM on: December 29, 2106 THURSDAY  REMEMBER: Instructions that are not followed completely may result in serious medical risk, up to and including death; or upon the discretion of your surgeon and anesthesiologist your surgery may need to be rescheduled.  Do not eat food after midnight the night before your procedure.  No gum chewing or hard candies.  You may however, drink CLEAR liquids up to 2 hours before you are scheduled to arrive at the hospital for your procedure.  Do not drink clear liquids within 2 hours of the start of your surgery.  Clear liquids include: - water  - apple juice without pulp - clear gatorade - black coffee or tea (Do NOT add anything to the coffee or tea) Do NOT drink anything that is not on this list.   No Alcohol for 24 hours before or after surgery.  No Smoking including e-cigarettes for 24 hours prior to surgery. No chewable tobacco products for at least 6 hours prior to surgery. No nicotine patches on the day of surgery.  Notify your doctor if there is any change in your medical condition (cold, fever, infection).  Do not wear jewelry, make-up, hairpins, clips or nail polish.  Do not wear lotions, powders, or perfumes. You may wear deodorant.  Do not shave 48 hours prior to surgery. Men may shave face and neck.  Contacts and dentures may not be worn into surgery.  Do not bring valuables to the hospital. [facility] is not responsible for any belongings or valuables.   TAKE THESE MEDICATIONS THE MORNING OF SURGERY WITH A SIP OF WATER: CYMBALTA PANTOPRAZOLE TAKE A DOSE THE NIGHT BEFORE AND THE DAY OF SURGERY KEFLEX IF NOT FINISHED  Stop Anti-inflammatories such as CELEBREX  Advil, Aleve, Ibuprofen, Motrin, Naproxen, Naprosyn, Goodie powder, or aspirin  products. (May take Tylenol or Acetaminophen if needed.)  Stop ANY OVER THE COUNTER supplements until after surgery. (May continue Vitamin D, Vitamin B, and multivitamin.)  If you are being admitted to the hospital overnight, leave your suitcase in the car. After surgery it may be brought to your room.  If you are being discharged the day of surgery, you will not be allowed to drive home. You will need someone to drive you home and stay with you that night.   If you are taking public transportation, you will need to have a responsible adult to with you.  Please call the number above if you have any questions about these instructions.

## 2016-12-23 LAB — CULTURE, BLOOD (ROUTINE X 2)
Culture: NO GROWTH
Culture: NO GROWTH
SPECIAL REQUESTS: ADEQUATE
SPECIAL REQUESTS: ADEQUATE

## 2016-12-24 DIAGNOSIS — N201 Calculus of ureter: Secondary | ICD-10-CM | POA: Insufficient documentation

## 2016-12-24 LAB — CULTURE, URINE COMPREHENSIVE

## 2016-12-27 ENCOUNTER — Telehealth: Payer: Self-pay

## 2016-12-27 NOTE — Anesthesia Postprocedure Evaluation (Signed)
Anesthesia Post Note  [name]  Procedure(s) Performed: CYSTOSCOPY WITH RETROGRADE PYELOGRAM/URETERAL STENT PLACEMENT (Left )  Patient location during evaluation: PACU Anesthesia Type: General Level of consciousness: awake and alert Pain management: pain level controlled Vital Signs Assessment: post-procedure vital signs reviewed and stable Respiratory status: spontaneous breathing, nonlabored ventilation, respiratory function stable and patient connected to nasal cannula oxygen Cardiovascular status: blood pressure returned to baseline and stable Postop Assessment: no apparent nausea or vomiting Anesthetic complications: no     Last Vitals:  Vitals:   12/20/16 0415 12/20/16 0737  BP: 129/70 125/70  Pulse: 70 71  Resp: 18 18  Temp: 36.9 C 36.9 C  SpO2: 97% 96%    Last Pain:  Vitals:   12/20/16 0758  TempSrc:   PainSc: 5                  Yevette EdwardsJames G Adams

## 2016-12-27 NOTE — Telephone Encounter (Signed)
-----   Message from Riki AltesScott C Stoioff, MD sent at 12/25/2016  2:40 PM EST ----- Urine culture was negative for infection

## 2016-12-27 NOTE — Telephone Encounter (Signed)
Patient notified

## 2016-12-28 MED ORDER — CEFAZOLIN SODIUM-DEXTROSE 2-4 GM/100ML-% IV SOLN
2.0000 g | INTRAVENOUS | Status: AC
  Administered 2016-12-29: 2 g via INTRAVENOUS

## 2016-12-29 ENCOUNTER — Other Ambulatory Visit: Payer: Self-pay

## 2016-12-29 ENCOUNTER — Encounter
Admission: RE | Disposition: A | Discharge status: Home / Self Care | Payer: Self-pay | Source: Ambulatory Visit | Attending: Urology

## 2016-12-29 ENCOUNTER — Encounter: Payer: Self-pay | Admitting: *Deleted

## 2016-12-29 ENCOUNTER — Ambulatory Visit: Payer: BLUE CROSS/BLUE SHIELD | Admitting: Anesthesiology

## 2016-12-29 ENCOUNTER — Inpatient Hospital Stay: Payer: BLUE CROSS/BLUE SHIELD | Admitting: Family Medicine

## 2016-12-29 ENCOUNTER — Ambulatory Visit
Admission: RE | Admit: 2016-12-29 | Discharge: 2016-12-29 | Disposition: A | Discharge status: Home / Self Care | Payer: BLUE CROSS/BLUE SHIELD | Source: Ambulatory Visit | Attending: Urology | Admitting: Urology

## 2016-12-29 DIAGNOSIS — Z9884 Bariatric surgery status: Secondary | ICD-10-CM | POA: Diagnosis not present

## 2016-12-29 DIAGNOSIS — Z87442 Personal history of urinary calculi: Secondary | ICD-10-CM | POA: Insufficient documentation

## 2016-12-29 DIAGNOSIS — N201 Calculus of ureter: Secondary | ICD-10-CM | POA: Diagnosis not present

## 2016-12-29 DIAGNOSIS — F419 Anxiety disorder, unspecified: Secondary | ICD-10-CM | POA: Diagnosis not present

## 2016-12-29 DIAGNOSIS — Z79899 Other long term (current) drug therapy: Secondary | ICD-10-CM | POA: Insufficient documentation

## 2016-12-29 DIAGNOSIS — K219 Gastro-esophageal reflux disease without esophagitis: Secondary | ICD-10-CM | POA: Diagnosis not present

## 2016-12-29 DIAGNOSIS — I10 Essential (primary) hypertension: Secondary | ICD-10-CM | POA: Insufficient documentation

## 2016-12-29 DIAGNOSIS — E785 Hyperlipidemia, unspecified: Secondary | ICD-10-CM | POA: Insufficient documentation

## 2016-12-29 HISTORY — PX: CYSTOSCOPY W/ URETERAL STENT PLACEMENT: SHX1429

## 2016-12-29 HISTORY — PX: CYSTOSCOPY/RETROGRADE/URETEROSCOPY/STONE EXTRACTION WITH BASKET: SHX5317

## 2016-12-29 LAB — POCT I-STAT 4, (NA,K, GLUC, HGB,HCT)
GLUCOSE: 91 mg/dL (ref 65–99)
HEMATOCRIT: 45 % (ref 36.0–46.0)
Hemoglobin: 15.3 g/dL — ABNORMAL HIGH (ref 12.0–15.0)
POTASSIUM: 3.8 mmol/L (ref 3.5–5.1)
SODIUM: 140 mmol/L (ref 135–145)

## 2016-12-29 SURGERY — CYSTOSCOPY, WITH CALCULUS REMOVAL USING BASKET
Anesthesia: General | Site: Ureter | Laterality: Left | Wound class: Clean Contaminated

## 2016-12-29 MED ORDER — SUGAMMADEX SODIUM 200 MG/2ML IV SOLN
INTRAVENOUS | Status: DC | PRN
  Administered 2016-12-29: 150 mg via INTRAVENOUS

## 2016-12-29 MED ORDER — OXYBUTYNIN CHLORIDE 5 MG PO TABS
ORAL_TABLET | ORAL | Status: AC
  Administered 2016-12-29: 5 mg via ORAL
  Filled 2016-12-29: qty 1

## 2016-12-29 MED ORDER — FENTANYL CITRATE (PF) 100 MCG/2ML IJ SOLN: 25.0000 ug | INTRAMUSCULAR | Status: DC | PRN

## 2016-12-29 MED ORDER — LACTATED RINGERS IV SOLN
INTRAVENOUS | Status: DC
  Administered 2016-12-29: 08:00:00 via INTRAVENOUS

## 2016-12-29 MED ORDER — ONDANSETRON HCL 4 MG/2ML IJ SOLN
INTRAMUSCULAR | Status: AC
  Filled 2016-12-29: qty 2

## 2016-12-29 MED ORDER — FENTANYL CITRATE (PF) 100 MCG/2ML IJ SOLN
INTRAMUSCULAR | Status: DC | PRN
  Administered 2016-12-29: 100 ug via INTRAVENOUS

## 2016-12-29 MED ORDER — SUCCINYLCHOLINE CHLORIDE 20 MG/ML IJ SOLN
INTRAMUSCULAR | Status: DC | PRN
  Administered 2016-12-29: 80 mg via INTRAVENOUS

## 2016-12-29 MED ORDER — FENTANYL CITRATE (PF) 100 MCG/2ML IJ SOLN
INTRAMUSCULAR | Status: AC
  Filled 2016-12-29: qty 2

## 2016-12-29 MED ORDER — MIDAZOLAM HCL 2 MG/2ML IJ SOLN
INTRAMUSCULAR | Status: DC | PRN
  Administered 2016-12-29: 2 mg via INTRAVENOUS

## 2016-12-29 MED ORDER — ROCURONIUM BROMIDE 100 MG/10ML IV SOLN
INTRAVENOUS | Status: DC | PRN
  Administered 2016-12-29: 20 mg via INTRAVENOUS
  Administered 2016-12-29: 10 mg via INTRAVENOUS

## 2016-12-29 MED ORDER — IOTHALAMATE MEGLUMINE 43 % IV SOLN
INTRAVENOUS | Status: DC | PRN
  Administered 2016-12-29: 15 mL via URETHRAL

## 2016-12-29 MED ORDER — PROPOFOL 10 MG/ML IV BOLUS
INTRAVENOUS | Status: DC | PRN
  Administered 2016-12-29: 100 mg via INTRAVENOUS

## 2016-12-29 MED ORDER — ONDANSETRON HCL 4 MG/2ML IJ SOLN
INTRAMUSCULAR | Status: DC | PRN
  Administered 2016-12-29: 4 mg via INTRAVENOUS

## 2016-12-29 MED ORDER — EPHEDRINE SULFATE 50 MG/ML IJ SOLN
INTRAMUSCULAR | Status: DC | PRN
  Administered 2016-12-29: 10 mg via INTRAVENOUS

## 2016-12-29 MED ORDER — OXYBUTYNIN CHLORIDE 5 MG PO TABS
5.0000 mg | ORAL_TABLET | Freq: Three times a day (TID) | ORAL | Status: DC | PRN
  Administered 2016-12-29: 5 mg via ORAL
  Filled 2016-12-29 (×2): qty 1

## 2016-12-29 MED ORDER — LIDOCAINE HCL (CARDIAC) 20 MG/ML IV SOLN
INTRAVENOUS | Status: DC | PRN
  Administered 2016-12-29: 40 mg via INTRAVENOUS

## 2016-12-29 MED ORDER — ONDANSETRON HCL 4 MG/2ML IJ SOLN: 4.0000 mg | Freq: Once | INTRAMUSCULAR | Status: DC | PRN

## 2016-12-29 MED ORDER — DEXAMETHASONE SODIUM PHOSPHATE 10 MG/ML IJ SOLN
INTRAMUSCULAR | Status: DC | PRN
  Administered 2016-12-29: 10 mg via INTRAVENOUS

## 2016-12-29 MED ORDER — PHENAZOPYRIDINE HCL 200 MG PO TABS: 200.0000 mg | ORAL_TABLET | Freq: Three times a day (TID) | ORAL | 0 refills | Status: DC | PRN

## 2016-12-29 MED ORDER — OXYBUTYNIN CHLORIDE 5 MG PO TABS: ORAL_TABLET | ORAL | 0 refills | Status: DC

## 2016-12-29 MED ORDER — MIDAZOLAM HCL 2 MG/2ML IJ SOLN
INTRAMUSCULAR | Status: AC
  Filled 2016-12-29: qty 2

## 2016-12-29 MED ORDER — PROPOFOL 10 MG/ML IV BOLUS
INTRAVENOUS | Status: AC
  Filled 2016-12-29: qty 20

## 2016-12-29 MED ORDER — CEFAZOLIN SODIUM-DEXTROSE 2-4 GM/100ML-% IV SOLN
INTRAVENOUS | Status: AC
  Filled 2016-12-29: qty 100

## 2016-12-29 SURGICAL SUPPLY — 31 items
BAG DRAIN CYSTO-URO LG1000N (MISCELLANEOUS) ×3 IMPLANT
BASKET ZERO TIP 1.9FR (BASKET) ×3 IMPLANT
BRUSH SCRUB EZ 1% IODOPHOR (MISCELLANEOUS) ×3 IMPLANT
BSKT STON RTRVL ZERO TP 1.9FR (BASKET) ×1
CATH URETL 5X70 OPEN END (CATHETERS) IMPLANT
CNTNR SPEC 2.5X3XGRAD LEK (MISCELLANEOUS) ×2
CONRAY 43 FOR UROLOGY 50M (MISCELLANEOUS) ×3 IMPLANT
CONT SPEC 4OZ STER OR WHT (MISCELLANEOUS) ×1
CONT SPEC 4OZ STRL OR WHT (MISCELLANEOUS) ×2
CONTAINER SPEC 2.5X3XGRAD LEK (MISCELLANEOUS) ×2 IMPLANT
DRAPE UTILITY 15X26 TOWEL STRL (DRAPES) ×3 IMPLANT
FIBER LASER LITHO 273 (Laser) IMPLANT
GLOVE BIO SURGEON STRL SZ8 (GLOVE) ×3 IMPLANT
GOWN STRL REUS W/ TWL LRG LVL3 (GOWN DISPOSABLE) ×4 IMPLANT
GOWN STRL REUS W/TWL LRG LVL3 (GOWN DISPOSABLE) ×6
GUIDEWIRE GREEN .038 145CM (MISCELLANEOUS) IMPLANT
INFUSOR MANOMETER BAG 3000ML (MISCELLANEOUS) ×3 IMPLANT
INTRODUCER DILATOR DOUBLE (INTRODUCER) IMPLANT
KIT RM TURNOVER CYSTO AR (KITS) ×3 IMPLANT
PACK CYSTO AR (MISCELLANEOUS) ×3 IMPLANT
SENSORWIRE 0.038 NOT ANGLED (WIRE) ×3
SET CYSTO W/LG BORE CLAMP LF (SET/KITS/TRAYS/PACK) ×3 IMPLANT
SHEATH URETERAL 12FRX35CM (MISCELLANEOUS) IMPLANT
SOL .9 NS 3000ML IRR  AL (IV SOLUTION) ×1
SOL .9 NS 3000ML IRR AL (IV SOLUTION) ×2
SOL .9 NS 3000ML IRR UROMATIC (IV SOLUTION) ×2 IMPLANT
STENT URET 6FRX24 CONTOUR (STENTS) IMPLANT
STENT URET 6FRX26 CONTOUR (STENTS) ×3 IMPLANT
SURGILUBE 2OZ TUBE FLIPTOP (MISCELLANEOUS) ×3 IMPLANT
WATER STERILE IRR 1000ML POUR (IV SOLUTION) ×3 IMPLANT
WIRE SENSOR 0.038 NOT ANGLED (WIRE) ×2 IMPLANT

## 2016-12-29 NOTE — Discharge Instructions (Signed)

## 2016-12-29 NOTE — Interval H&P Note (Signed)
History and Physical Interval Note:  12/29/2016 8:19 AM  Tiffany Campos  has presented today for surgery, with the diagnosis of left ureteral stone  The various methods of treatment have been discussed with the patient and family. After consideration of risks, benefits and other options for treatment, the patient has consented to  Procedure(s): CYSTOSCOPY/URETEROSCOPY/HOLMIUM LASER/STENT exchange (Left) as a surgical intervention .  The patient's history has been reviewed, patient examined, no change in status, stable for surgery.  I have reviewed the patient's chart and labs.  Questions were answered to the patient's satisfaction.     Bookert Guzzi C Faithanne Verret

## 2016-12-29 NOTE — Transfer of Care (Signed)
Immediate Anesthesia Transfer of Care Note  [name]  Procedure(s) Performed: CYSTOSCOPY/RETROGRADE/URETEROSCOPY/STONE EXTRACTION WITH BASKET (Left Ureter) CYSTOSCOPY WITH STENT REPLACEMENT (Left Ureter)  Patient Location: PACU  Anesthesia Type:General  Level of Consciousness: awake, alert  and oriented  Airway & Oxygen Therapy: Patient Spontanous Breathing and Patient connected to face mask oxygen  Post-op Assessment: Report given to RN, Post -op Vital signs reviewed and stable and Patient moving all extremities X 4  Post vital signs: Reviewed and stable  Last Vitals:  Vitals:   12/29/16 0734 12/29/16 0922  BP: 116/77 116/64  Pulse: 64 81  Resp: 12 16  Temp: 36.9 C   SpO2: 100% 100%    Last Pain:  Vitals:   12/29/16 0734  TempSrc: Tympanic  PainSc: 4          Complications: No apparent anesthesia complications

## 2016-12-29 NOTE — Anesthesia Post-op Follow-up Note (Signed)
Anesthesia QCDR form completed.        

## 2016-12-29 NOTE — Anesthesia Preprocedure Evaluation (Addendum)
Anesthesia Evaluation  Patient identified by MRN, date of birth, ID band Patient awake    Reviewed: Allergy & Precautions, NPO status , Patient's Chart, lab work & pertinent test results, reviewed documented beta blocker date and time   Airway Mallampati: III  TM Distance: >3 FB     Dental  (+) Chipped   Pulmonary           Cardiovascular hypertension, Pt. on medications      Neuro/Psych PSYCHIATRIC DISORDERS Anxiety    GI/Hepatic GERD  Controlled,  Endo/Other    Renal/GU Renal disease     Musculoskeletal  (+) Arthritis ,   Abdominal   Peds  Hematology   Anesthesia Other Findings She has had a gastric banding and HH repair. Increased risk of aspiration. She does have regurg. Will intubate.  Reproductive/Obstetrics                            Anesthesia Physical Anesthesia Plan  ASA: II  Anesthesia Plan: General   Post-op Pain Management:    Induction: Intravenous  PONV Risk Score and Plan:   Airway Management Planned: Oral ETT  Additional Equipment:   Intra-op Plan:   Post-operative Plan:   Informed Consent: I have reviewed the patients History and Physical, chart, labs and discussed the procedure including the risks, benefits and alternatives for the proposed anesthesia with the patient or authorized representative who has indicated his/her understanding and acceptance.     Plan Discussed with: CRNA  Anesthesia Plan Comments:        Anesthesia Quick Evaluation

## 2016-12-29 NOTE — Anesthesia Postprocedure Evaluation (Signed)
Anesthesia Post Note  Patient: Nutritional therapistvangeline Huxford  Procedure(s) Performed: CYSTOSCOPY/RETROGRADE/URETEROSCOPY/STONE EXTRACTION WITH BASKET (Left Ureter) CYSTOSCOPY WITH STENT REPLACEMENT (Left Ureter)  Patient location during evaluation: PACU Anesthesia Type: General Level of consciousness: awake and alert Pain management: pain level controlled Vital Signs Assessment: post-procedure vital signs reviewed and stable Respiratory status: spontaneous breathing, nonlabored ventilation, respiratory function stable and patient connected to nasal cannula oxygen Cardiovascular status: blood pressure returned to baseline and stable Postop Assessment: no apparent nausea or vomiting Anesthetic complications: no     Last Vitals:  Vitals:   12/29/16 0954 12/29/16 1019  BP: 122/71 126/73  Pulse: 70 69  Resp: 16   Temp:    SpO2: 99% 100%    Last Pain:  Vitals:   12/29/16 0936  TempSrc:   PainSc: 0-No pain                 Torre Pikus S

## 2016-12-29 NOTE — Anesthesia Procedure Notes (Signed)
Procedure Name: Intubation Date/Time: 12/29/2016 8:35 AM Performed by: Lorie Apley, CRNA Pre-anesthesia Checklist: Patient identified, Patient being monitored, Timeout performed, Emergency Drugs available and Suction available Patient Re-evaluated:Patient Re-evaluated prior to induction Oxygen Delivery Method: Circle system utilized Preoxygenation: Pre-oxygenation with 100% oxygen Induction Type: IV induction Ventilation: Mask ventilation without difficulty Laryngoscope Size: Mac and 3 Grade View: Grade I Tube type: Oral Tube size: 7.5 mm Number of attempts: 1 Airway Equipment and Method: Stylet Placement Confirmation: ETT inserted through vocal cords under direct vision,  positive ETCO2 and breath sounds checked- equal and bilateral Secured at: 21 cm Tube secured with: Tape Dental Injury: Teeth and Oropharynx as per pre-operative assessment

## 2016-12-29 NOTE — Op Note (Signed)
Preoperative diagnosis: Left distal ureteral calculus  Postoperative diagnosis: Left distal ureteral calculus  Procedure:  1. Cystoscopy 2. Left ureteroscopy and stone removal 3. Left ureteral stent placement 4. Left retrograde pyelography with interpretation  Surgeon: Lorin PicketScott C. Stoioff, M.D.  Anesthesia: General  Complications: None  Intraoperative findings: Left retrograde pyelography post procedure demonstrated no filling defects within the ureter, calyces or renal pelvis.  EBL: Minimal  Specimens: 1. Calculus for analysis   Indication: Tiffany Campos is a 56 y.o. year old patient who presented to the ED on 12/18/2016 with fever to 103 degrees and had a 5 mm left distal ureteral calculus.  She underwent left ureteral stent placement by Dr. Mena GoesEskridge on 12/18/2016 and presents for definitive stone treatment.  CT also showed several small nonobstructing left renal calculi measuring between 1-2 mm.     After reviewing the management options for treatment, the patient elected to proceed with the above surgical procedure(s). We have discussed the potential benefits and risks of the procedure, side effects of the proposed treatment, the likelihood of the patient achieving the goals of the procedure, and any potential problems that might occur during the procedure or recuperation. Informed consent has been obtained.  Description of procedure:  The patient was taken to the operating room and general anesthesia was induced.  The patient was placed in the dorsal lithotomy position, prepped and draped in the usual sterile fashion, and preoperative antibiotics were administered. A preoperative time-out was performed.   Cystourethroscopy was performed.  The distal stent and was visualized exiting the left ureteral orifice.  There was no evidence for any bladder tumors, stones, or other mucosal pathology.    The stent was grasped with endoscopic forceps and brought out to the urethral  meatus.  A 0.038 Sensor wire was placed through the stent and advanced to the renal pelvis with position verified by fluoroscopy.  A semirigid ureteroscope was then passed per urethra and the left ureteral orifice was easily engaged.  The stone was visualized approximately 3 cm from the UVJ.  It was placed on a 1.9 French nitinol stone basket and removed without difficulty.    A digital flexible ureteroscope was then passed per urethra and advanced into the ureter.  The ureteroscope was advanced into the renal pelvis.  No additional ureteral calculi or ureteral abnormalities were noted.  Contrast was injected through the ureteroscope and no filling defects were noted.  All calyces were examined.  There were minute fragments noted in the upper and lower poles all less than 1 mm in size and too small to basket.  The ureteroscope and guidewire were removed.  The semirigid ureteroscope was repassed into the distal ureter and retrograde pyelogram was performed.  There was prompt emptying of contrast from the ureter however some retention of ureter within the collecting system.  It was elected to replace the ureteral stent.  The guidewire was replaced through the ureteroscope and a 6 French/26 cm double-J ureteral stent with string was placed under fluoroscopic guidance.  Good stent positioning was noted proximally and distally.  The bladder was then emptied and the procedure ended.  The patient appeared to tolerate the procedure well and without complications.  After anesthetic reversal she was transferred to the PACU in satisfactory condition.   She will remove her stent on Monday 01/01/2017

## 2016-12-30 ENCOUNTER — Encounter: Payer: Self-pay | Admitting: Urology

## 2017-01-03 LAB — STONE ANALYSIS
CA OXALATE, DIHYDRATE: 3 %
CA OXALATE, MONOHYDR.: 77 %
CA PHOS CRY STONE QL IR: 20 %
Stone Weight KSTONE: 38.7 mg

## 2017-01-04 ENCOUNTER — Ambulatory Visit: Payer: Self-pay

## 2017-02-13 ENCOUNTER — Other Ambulatory Visit: Payer: Self-pay | Admitting: *Deleted

## 2017-02-13 MED ORDER — CYANOCOBALAMIN 1000 MCG/ML IJ SOLN: INTRAMUSCULAR | 0 refills | Status: DC

## 2017-03-07 ENCOUNTER — Other Ambulatory Visit: Payer: Self-pay | Admitting: Family Medicine

## 2017-03-07 NOTE — Telephone Encounter (Signed)
Sent. Thanks.   

## 2017-03-07 NOTE — Telephone Encounter (Signed)
Electronic refill Last refill Alprazolam 12/14/17 #60/1 Tramadol last refill 10/31/16 #30/1 Last office visit 12/14/16

## 2017-04-02 ENCOUNTER — Ambulatory Visit: Payer: BLUE CROSS/BLUE SHIELD | Admitting: Family Medicine

## 2017-04-02 ENCOUNTER — Encounter: Payer: Self-pay | Admitting: Family Medicine

## 2017-04-02 VITALS — BP 124/78 | HR 68 | Temp 98.4°F | Wt 182.5 lb

## 2017-04-02 DIAGNOSIS — R05 Cough: Secondary | ICD-10-CM | POA: Diagnosis not present

## 2017-04-02 DIAGNOSIS — A419 Sepsis, unspecified organism: Secondary | ICD-10-CM

## 2017-04-02 DIAGNOSIS — R059 Cough, unspecified: Secondary | ICD-10-CM

## 2017-04-02 MED ORDER — PREDNISONE 20 MG PO TABS: ORAL_TABLET | ORAL | 0 refills | Status: DC

## 2017-04-02 MED ORDER — HYDROCODONE-HOMATROPINE 5-1.5 MG/5ML PO SYRP: 5.0000 mL | ORAL_SOLUTION | Freq: Three times a day (TID) | ORAL | 0 refills | Status: DC | PRN

## 2017-04-02 MED ORDER — AZITHROMYCIN 250 MG PO TABS: ORAL_TABLET | ORAL | 0 refills | Status: DC

## 2017-04-02 NOTE — Progress Notes (Signed)
Cough.  Going on for about 2-3 weeks.  Some sputum.  Worse at night.  Possible scant wheeze.  She used albuterol neb last night, coughed more after that.  No fevers.  No vomiting, no diarrhea.  Some ear pain.  Some ST and facial congestion prev.    She felt similar with h/o walking PNA about 20 years ago.   Meds, vitals, and allergies reviewed.   ROS: Per HPI unless specifically indicated in ROS section   GEN: nad, alert and oriented HEENT: mucous membranes moist, tm w/o erythema, nasal exam w/o erythema, clear discharge noted,  OP with cobblestoning NECK: supple w/o LA CV: rrr.   PULM: ctab, no inc wob, but sig dry cough noted in fits at the OV.   EXT: no edema SKIN: well perfused.

## 2017-04-02 NOTE — Patient Instructions (Signed)
Start zithromax and prednisone.  Take prednisone with food, okay to split the dose through the day.  Rest and fluids.  Update us as needed.  Take care.  Glad to see you.

## 2017-04-04 DIAGNOSIS — R05 Cough: Secondary | ICD-10-CM | POA: Insufficient documentation

## 2017-04-04 DIAGNOSIS — R059 Cough, unspecified: Secondary | ICD-10-CM | POA: Insufficient documentation

## 2017-04-04 NOTE — Assessment & Plan Note (Signed)
Resolved.  I appreciate the help of all involved. We talked about her previous admission for sepsis with the rationale for treatment and her diagnosis.  She was asking questions about why she was not able to be seen here that day in the clinic, back in December.  I advised her that since she was septic and needed admission to the hospital it was more important for her to be in the hospital then to be at our clinic.  Additionally I had been out of clinic due to an illness around that time and that limited my availability for same-day appointments.  All discussed and questions answered.  >25 minutes spent in face to face time with patient, >50% spent in counselling or coordination of care.

## 2017-04-04 NOTE — Assessment & Plan Note (Signed)
Nontoxic but the concern is for walking pneumonia/atypical pneumonia.  Discussed with patient.  She had an episode years ago and this felt similar.  Given the duration and her situation I would go ahead and treat.  All questions answered. Start zithromax and prednisone.  Take prednisone with food, okay to split the dose through the day.  Rest and fluids.  Update us as needed.  She agrees.

## 2017-05-19 ENCOUNTER — Other Ambulatory Visit: Payer: Self-pay | Admitting: Family Medicine

## 2017-05-21 NOTE — Telephone Encounter (Signed)
Electronic refill request Last office visit 04/02/17 Last refill 03/07/17 #60/1

## 2017-05-22 NOTE — Telephone Encounter (Signed)
Sent. Thanks.   

## 2017-07-09 ENCOUNTER — Telehealth (INDEPENDENT_AMBULATORY_CARE_PROVIDER_SITE_OTHER): Payer: Self-pay | Admitting: Physical Medicine and Rehabilitation

## 2017-07-09 ENCOUNTER — Other Ambulatory Visit (INDEPENDENT_AMBULATORY_CARE_PROVIDER_SITE_OTHER): Payer: Self-pay | Admitting: Physical Medicine and Rehabilitation

## 2017-07-09 MED ORDER — PREDNISONE 50 MG PO TABS: ORAL_TABLET | ORAL | 0 refills | Status: DC

## 2017-07-09 NOTE — Telephone Encounter (Signed)
Bilateral L4-5 facets or oral prednisone, let me know

## 2017-07-09 NOTE — Telephone Encounter (Signed)
Would like to try oral Prednisone. [facility].

## 2017-08-10 ENCOUNTER — Other Ambulatory Visit: Payer: Self-pay | Admitting: Family Medicine

## 2017-08-10 NOTE — Telephone Encounter (Signed)
Electronic refill request. Celebrex Last office visit:   04/02/17 Last Filled:    90 capsule 1 12/14/2016  Please advise.

## 2017-08-12 NOTE — Telephone Encounter (Signed)
Sent. Thanks.   

## 2017-08-19 ENCOUNTER — Other Ambulatory Visit: Payer: Self-pay | Admitting: Family Medicine

## 2017-08-20 NOTE — Telephone Encounter (Signed)
Name of Medication: Alprazolam and Tramadol Name of Pharmacy: [facility] Last Fill or Written Date and Quantity: Alprazolam 05/22/17 #60/1 Tramadol 03/07/17 #30/1 Last Office Visit and Type: 04/02/17 acute Next Office Visit and Type: Upcoming 12/17/17 CPE Last Controlled Substance Agreement Date: none Last ZOX:WRUEDS:none

## 2017-08-21 NOTE — Telephone Encounter (Signed)
Sent. Thanks.   

## 2017-09-04 ENCOUNTER — Telehealth (INDEPENDENT_AMBULATORY_CARE_PROVIDER_SITE_OTHER): Payer: Self-pay | Admitting: Physical Medicine and Rehabilitation

## 2017-09-04 NOTE — Telephone Encounter (Signed)
Sure

## 2017-09-05 NOTE — Telephone Encounter (Signed)
Is auth needed for bilateral [id number]? Patient scheduled for 09/13/17 at 1415 pending auth.

## 2017-09-05 NOTE — Telephone Encounter (Signed)
Per [employer] representative code [phone] does not require prior authorization.  Ref# [id number] [email or web address]

## 2017-09-13 ENCOUNTER — Ambulatory Visit (INDEPENDENT_AMBULATORY_CARE_PROVIDER_SITE_OTHER): Payer: Self-pay

## 2017-09-13 ENCOUNTER — Ambulatory Visit (INDEPENDENT_AMBULATORY_CARE_PROVIDER_SITE_OTHER): Payer: BLUE CROSS/BLUE SHIELD | Admitting: Physical Medicine and Rehabilitation

## 2017-09-13 ENCOUNTER — Encounter (INDEPENDENT_AMBULATORY_CARE_PROVIDER_SITE_OTHER): Payer: Self-pay | Admitting: Physical Medicine and Rehabilitation

## 2017-09-13 VITALS — BP 143/81 | HR 60 | Temp 98.3°F

## 2017-09-13 DIAGNOSIS — M47816 Spondylosis without myelopathy or radiculopathy, lumbar region: Secondary | ICD-10-CM | POA: Diagnosis not present

## 2017-09-13 MED ORDER — METHYLPREDNISOLONE ACETATE 80 MG/ML IJ SUSP
80.0000 mg | Freq: Once | INTRAMUSCULAR | Status: AC
  Administered 2017-09-13: 80 mg

## 2017-09-13 NOTE — Patient Instructions (Signed)

## 2017-09-13 NOTE — Progress Notes (Signed)
 .  Numeric Pain Rating Scale and Functional Assessment Average Pain 5   In the last MONTH (on 0-10 scale) has pain interfered with the following?  1. General activity like being  able to carry out your everyday physical activities such as walking, climbing stairs, carrying groceries, or moving a chair?  Rating(6)   +Driver, -BT, -Dye Allergies.  

## 2017-09-26 NOTE — Procedures (Signed)
Lumbar Diagnostic Facet Joint Nerve Block with Fluoroscopic Guidance   [name]      Date of Birth: 09-06-1960 MRN: [id number] PCP: Joaquim Nam, MD      Visit Date: 09/13/2017   Universal Protocol:    Date/Time: 09/25/196:16 AM  Consent Given By: the patient  Position: PRONE  Additional Comments: Vital signs were monitored before and after the procedure. Patient was prepped and draped in the usual sterile fashion. The correct patient, procedure, and site was verified.   Injection Procedure Details:  Procedure Site One Meds Administered:  Meds ordered this encounter  Medications  . methylPREDNISolone acetate (DEPO-MEDROL) injection 80 mg     Laterality: Bilateral  Location/Site:  L4-L5 L5-S1  Needle size: 22 ga.  Needle type:spinal  Needle Placement: Oblique pedical  Findings:   -Comments: There was excellent flow of contrast along the articular pillars without intravascular flow.  Procedure Details: The fluoroscope beam is vertically oriented in AP and then obliqued 15 to 20 degrees to the ipsilateral side of the desired nerve to achieve the "Scotty dog" appearance.  The skin over the target area of the junction of the superior articulating process and the transverse process (sacral ala if blocking the L5 dorsal rami) was locally anesthetized with a 1 ml volume of 1% Lidocaine without Epinephrine.  The spinal needle was inserted and advanced in a trajectory view down to the target.   After contact with periosteum and negative aspirate for blood and CSF, correct placement without intravascular or epidural spread was confirmed by injecting 0.5 ml. of Isovue-250.  A spot radiograph was obtained of this image.    Next, a 0.5 ml. volume of the injectate described above was injected. The needle was then redirected to the other facet joint nerves mentioned above if needed.  Prior to the procedure, the patient was given a Pain Diary which was completed  for baseline measurements.  After the procedure, the patient rated their pain every 30 minutes and will continue rating at this frequency for a total of 5 hours.  The patient has been asked to complete the Diary and return to [place] by mail, fax or hand delivered as soon as possible.   Additional Comments:  The patient tolerated the procedure well Dressing: Band-Aid    Post-procedure details: Patient was observed during the procedure. Post-procedure instructions were reviewed.  Patient left the clinic in stable condition.

## 2017-09-26 NOTE — Progress Notes (Signed)
Tiffany Campos - 57 y.o. female MRN [id number]  Date of birth: 1960/03/10  Office Visit Note: Visit Date: 09/13/2017 PCP: Tiffany Nam, MD Referred by: Tiffany Nam, MD  Subjective: Chief Complaint  Patient presents with  . Right Hip - Pain  . Lower Back - Pain   HPI: Tiffany Campos is a 57 year old female that we have seen off and on for several years very intermittently.  Last time I saw her was in the spring of last year and we completed diagnostic and therapeutic medial branch blocks of the lumbar facet joints at L4-5 and L5-S1.  She got really good relief with this for a while but then had some medical complications and just really has not returned to see [place].  She normally is followed by Tiffany Campos and Tiffany Campos.  She is also seeing Dr. Roda Campos in our office.  She really has pain all over and I am unsure of the diagnosis of fibromyalgia.  Her biggest complaint however is axial low back pain right at the L4 and L5 level.  Worse today on exam with facet joint extension and facet joint loading.  No radicular pain or paresthesia.  MRI findings consistent with facet arthropathy.  No radicular complaints or nerve compression.  Has had physical therapy in the past and continues to try to stay active.  She has had pain ongoing for many years.  We are going to complete a second set of diagnostic and hopefully therapeutic facet blocks and then look at radiofrequency ablation.   ROS Otherwise per HPI.  Assessment & Plan: Visit Diagnoses:  1. Spondylosis without myelopathy or radiculopathy, lumbar region     Plan: No additional findings.   Meds & Orders:  Meds ordered this encounter  Medications  . methylPREDNISolone acetate (DEPO-MEDROL) injection 80 mg    Orders Placed This Encounter  Procedures  . Facet Injection  . XR C-ARM NO REPORT    Follow-up: Return if symptoms worsen or fail to improve.   Procedures: No procedures performed  Lumbar Diagnostic Facet Joint  Nerve Block with Fluoroscopic Guidance   [name]      Date of Birth: [birth year] MRN: [id number] PCP: Tiffany Nam, MD      Visit Date: 09/13/2017   Universal Protocol:    Date/Time: 09/25/196:16 AM  Consent Given By: the patient  Position: PRONE  Additional Comments: Vital signs were monitored before and after the procedure. Patient was prepped and draped in the usual sterile fashion. The correct patient, procedure, and site was verified.   Injection Procedure Details:  Procedure Site One Meds Administered:  Meds ordered this encounter  Medications  . methylPREDNISolone acetate (DEPO-MEDROL) injection 80 mg     Laterality: Bilateral  Location/Site:  L4-L5 L5-S1  Needle size: 22 ga.  Needle type:spinal  Needle Placement: Oblique pedical  Findings:   -Comments: There was excellent flow of contrast along the articular pillars without intravascular flow.  Procedure Details: The fluoroscope beam is vertically oriented in AP and then obliqued 15 to 20 degrees to the ipsilateral side of the desired nerve to achieve the "Scotty dog" appearance.  The skin over the target area of the junction of the superior articulating process and the transverse process (sacral ala if blocking the L5 dorsal rami) was locally anesthetized with a 1 ml volume of 1% Lidocaine without Epinephrine.  The spinal needle was inserted and advanced in a trajectory view down to the target.   After contact with  periosteum and negative aspirate for blood and CSF, correct placement without intravascular or epidural spread was confirmed by injecting 0.5 ml. of Isovue-250.  A spot radiograph was obtained of this image.    Next, a 0.5 ml. volume of the injectate described above was injected. The needle was then redirected to the other facet joint nerves mentioned above if needed.  Prior to the procedure, the patient was given a Pain Diary which was completed for baseline measurements.   After the procedure, the patient rated their pain every 30 minutes and will continue rating at this frequency for a total of 5 hours.  The patient has been asked to complete the Diary and return to [place] by mail, fax or hand delivered as soon as possible.   Additional Comments:  The patient tolerated the procedure well Dressing: Band-Aid    Post-procedure details: Patient was observed during the procedure. Post-procedure instructions were reviewed.  Patient left the clinic in stable condition.   Clinical History: Lumbar spine MRI dated 10/30/2015 FINDINGS: Segmentation: Normal  Alignment: Mild anterior listhesis at L4-5 and L5-S1. Remaining alignment normal  Vertebrae: Negative for fracture or mass. Normal bone marrow.  Conus medullaris: Extends to the mid L2 level and appears normal.  Paraspinal and other soft tissues: Paraspinous muscles are symmetric and without focal abnormality. Retroperitoneal structures normal.  Disc levels:  L1-2: Negative  L2-3: Negative  L3-4: Mild disc bulging and mild facet degeneration without significant stenosis  L4-5: 3 mm anterior listhesis. Disc bulging and moderate facet degeneration. No significant spinal stenosis. Mild foraminal narrowing on the right without significant neural impingement.  L5-S1: 3 mm anterior listhesis. Bilateral facet degeneration left greater than right. No significant canal or foraminal stenosis.  IMPRESSION: Mild degenerative slip at L4-5 and L5-S1 due to facet degeneration. No significant spinal stenosis or neural impingement.   She reports that she has never smoked. She has never used smokeless tobacco. No results for input(s): HGBA1C, LABURIC in the last 8760 hours.  Objective:  VS:  HT:    WT:   BMI:     BP:(!) 143/81  HR:60bpm  TEMP:98.3 F (36.8 C)(Oral)  RESP:  Physical Exam  Musculoskeletal:  Patient has pain with extension of the lumbar spine and facet joint loading.  She ambulates without  aid.  She has some difficulty going to full extension with standing.  Good distal strength.    Ortho Exam Imaging: No results found.  Past Medical/Family/Surgical/Social History: Medications & Allergies reviewed per EMR, new medications updated. Patient Active Problem List   Diagnosis Date Noted  . Cough 04/04/2017  . Ureteral calculus 12/24/2016  . Sepsis (HCC) 12/18/2016  . Arthritis of carpometacarpal (CMC) joints of both thumbs 02/29/2016  . Edema 12/14/2015  . Low back pain 11/05/2015  . Trochanteric bursitis, left hip 11/05/2015  . Syncope 07/21/2015  . Fatigue 07/21/2015  . B12 deficiency 07/21/2015  . Routine general medical examination at a health care facility 11/11/2014  . Advance care planning 11/11/2014  . Rash and nonspecific skin eruption 11/11/2014  . Allergic dermatitis 11/03/2014  . Obesity 11/06/2013  . Essential hypertension 11/06/2013  . OA (osteoarthritis) 11/06/2013  . Generalized anxiety disorder 11/06/2013  . GERD (gastroesophageal reflux disease) 11/06/2013  . Lapband APS + [facility] repair August 2011 03/13/2013   Past Medical History:  Diagnosis Date  . Anxiety   . Arthritis   . Diverticulosis   . GERD (gastroesophageal reflux disease)   . Hyperlipidemia   . Hypertension   .  Joint pain   . Kidney stones   . 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 Stress incontinence    Family History  Problem Relation Age of Onset  . Peripheral vascular disease Father   . GER disease Father   . Diabetes Mother   . Hypertension Mother   . Arthritis Mother   . GER disease Mother   . Cancer Mother        carcinoid tumor  . Colon cancer Maternal Uncle        possible colon cancer dx  . Breast cancer Neg Hx    Past Surgical History:  Procedure Laterality Date  . ABDOMINAL HYSTERECTOMY    . CHOLECYSTECTOMY    . CYSTOSCOPY W/ URETERAL STENT PLACEMENT Left 12/18/2016   Procedure: CYSTOSCOPY WITH RETROGRADE  PYELOGRAM/URETERAL STENT PLACEMENT;  Surgeon: Jerilee Field, MD;  Location: [facility];  Service: Urology;  Laterality: Left;  . CYSTOSCOPY W/ URETERAL STENT PLACEMENT Left 12/29/2016   Procedure: CYSTOSCOPY WITH STENT REPLACEMENT;  Surgeon: Riki Altes, MD;  Location: [facility] OR[facility];  Service: Urology;  Laterality: Left;  . CYSTOSCOPY/RETROGRADE/URETEROSCOPY/STONE EXTRACTION WITH BASKET Left 12/29/2016   Procedure: CYSTOSCOPY/RETROGRADE/URETEROSCOPY/STONE EXTRACTION WITH BASKET;  Surgeon: Riki Altes, MD;  Location: [facility] ORS;  Service: Urology;  Laterality: Left;  . ECTOPIC PREGNANCY SURGERY    . LAPAROSCOPIC GASTRIC BANDING  08/03/2009  . ROTATOR CUFF REPAIR  05-25-11   right, with distal clavicle resection  . SHOULDER ARTHROSCOPY Right   . tmj    . TONSILLECTOMY AND ADENOIDECTOMY     Social History   Occupational History  . Not on file  Tobacco Use  . Smoking status: Never Smoker  . Smokeless tobacco: Never Used  Substance and Sexual Activity  . Alcohol use: Yes    Alcohol/week: 14.0 standard drinks    Types: 14 Glasses of wine per week  . Drug use: No  . Sexual activity: Not on file

## 2017-11-02 ENCOUNTER — Other Ambulatory Visit: Payer: Self-pay | Admitting: Family Medicine

## 2017-11-05 ENCOUNTER — Other Ambulatory Visit: Payer: Self-pay | Admitting: Family Medicine

## 2017-11-05 NOTE — Telephone Encounter (Signed)
Electronic refill request. Celebrex Last office visit:   04/02/17 Acute   Appt scheduled in December. Last Filled:     90 capsule 0 08/12/2017  Please advise.

## 2017-11-06 NOTE — Telephone Encounter (Signed)
Sent. Thanks.   

## 2017-11-21 ENCOUNTER — Telehealth: Payer: Self-pay | Admitting: *Deleted

## 2017-11-21 MED ORDER — ACYCLOVIR 5 % EX OINT: 1.0000 "application " | TOPICAL_OINTMENT | CUTANEOUS | 0 refills | Status: DC

## 2017-11-21 NOTE — Telephone Encounter (Signed)
Patient notified as instructed by telephone and verbalized understanding. 

## 2017-11-21 NOTE — Telephone Encounter (Signed)
Patient called stating that she has a cold sore that has come up on her lip. Patient stated that she has not had one of these for a long time, but was at the dentist the other day and they pulled on her lip a lot and must have aggravated her mouth. Patient stated that she use to be given a tube of acyclovir which helped. Patient wants to know if you can send her something in and whatever is best for this now  days?. Pharmacy [facility]

## 2017-11-21 NOTE — Telephone Encounter (Signed)
Would try the ointment if she has used that in the past.   Sent.  Thanks.

## 2017-12-03 LAB — HM MAMMOGRAPHY

## 2017-12-13 ENCOUNTER — Other Ambulatory Visit (INDEPENDENT_AMBULATORY_CARE_PROVIDER_SITE_OTHER): Payer: BLUE CROSS/BLUE SHIELD

## 2017-12-13 ENCOUNTER — Other Ambulatory Visit: Payer: Self-pay | Admitting: Family Medicine

## 2017-12-13 DIAGNOSIS — E538 Deficiency of other specified B group vitamins: Secondary | ICD-10-CM

## 2017-12-13 DIAGNOSIS — I10 Essential (primary) hypertension: Secondary | ICD-10-CM

## 2017-12-13 LAB — LIPID PANEL
CHOL/HDL RATIO: 3
Cholesterol: 205 mg/dL — ABNORMAL HIGH (ref 0–200)
HDL: 75.5 mg/dL (ref >39.00)
LDL Cholesterol: 118 mg/dL — ABNORMAL HIGH (ref 0–99)
NonHDL: 129.62
Triglycerides: 59 mg/dL (ref 0.0–149.0)
VLDL: 11.8 mg/dL (ref 0.0–40.0)

## 2017-12-13 LAB — CBC WITH DIFFERENTIAL/PLATELET
Basophils Absolute: 0.1 10*3/uL (ref 0.0–0.1)
Basophils Relative: 1.7 % (ref 0.0–3.0)
Eosinophils Absolute: 0.2 10*3/uL (ref 0.0–0.7)
Eosinophils Relative: 4.7 % (ref 0.0–5.0)
HCT: 44.7 % (ref 36.0–46.0)
Hemoglobin: 14.9 g/dL (ref 12.0–15.0)
Lymphocytes Relative: 35 % (ref 12.0–46.0)
Lymphs Abs: 1.7 10*3/uL (ref 0.7–4.0)
MCHC: 33.4 g/dL (ref 30.0–36.0)
MCV: 95.4 fl (ref 78.0–100.0)
MONO ABS: 0.4 10*3/uL (ref 0.1–1.0)
Monocytes Relative: 7.4 % (ref 3.0–12.0)
NEUTROS ABS: 2.5 10*3/uL (ref 1.4–7.7)
Neutrophils Relative %: 51.2 % (ref 43.0–77.0)
Platelets: 220 10*3/uL (ref 150.0–400.0)
RBC: 4.68 Mil/uL (ref 3.87–5.11)
RDW: 12.6 % (ref 11.5–15.5)
WBC: 4.9 10*3/uL (ref 4.0–10.5)

## 2017-12-13 LAB — COMPREHENSIVE METABOLIC PANEL
ALT: 15 U/L (ref 0–35)
AST: 21 U/L (ref 0–37)
Albumin: 4.1 g/dL (ref 3.5–5.2)
Alkaline Phosphatase: 92 U/L (ref 39–117)
BUN: 17 mg/dL (ref 6–23)
CO2: 29 mEq/L (ref 19–32)
Calcium: 9.2 mg/dL (ref 8.4–10.5)
Chloride: 106 mEq/L (ref 96–112)
Creatinine, Ser: 0.74 mg/dL (ref 0.40–1.20)
GFR: 85.44 mL/min (ref >60.00)
Glucose, Bld: 96 mg/dL (ref 70–99)
Potassium: 3.9 mEq/L (ref 3.5–5.1)
SODIUM: 141 meq/L (ref 135–145)
Total Bilirubin: 0.7 mg/dL (ref 0.2–1.2)
Total Protein: 6.6 g/dL (ref 6.0–8.3)

## 2017-12-13 LAB — VITAMIN B12: Vitamin B-12: 1247 pg/mL — ABNORMAL HIGH (ref 211–911)

## 2017-12-17 ENCOUNTER — Encounter: Payer: Self-pay | Admitting: Family Medicine

## 2017-12-17 ENCOUNTER — Ambulatory Visit (INDEPENDENT_AMBULATORY_CARE_PROVIDER_SITE_OTHER): Payer: BLUE CROSS/BLUE SHIELD | Admitting: Family Medicine

## 2017-12-17 VITALS — BP 142/88 | HR 63 | Temp 98.3°F | Ht 66.0 in | Wt 183.5 lb

## 2017-12-17 DIAGNOSIS — Z7189 Other specified counseling: Secondary | ICD-10-CM

## 2017-12-17 DIAGNOSIS — F411 Generalized anxiety disorder: Secondary | ICD-10-CM

## 2017-12-17 DIAGNOSIS — Z9884 Bariatric surgery status: Secondary | ICD-10-CM

## 2017-12-17 DIAGNOSIS — I10 Essential (primary) hypertension: Secondary | ICD-10-CM

## 2017-12-17 DIAGNOSIS — E538 Deficiency of other specified B group vitamins: Secondary | ICD-10-CM

## 2017-12-17 DIAGNOSIS — Z0000 Encounter for general adult medical examination without abnormal findings: Secondary | ICD-10-CM

## 2017-12-17 DIAGNOSIS — M158 Other polyosteoarthritis: Secondary | ICD-10-CM

## 2017-12-17 NOTE — Patient Instructions (Addendum)
Call urology about a follow up in 2020.  Check your BP at home.   Check with the arthritis clinic about options.  Take care.  Glad to see you.   You can try adding on questran and see how that does.

## 2017-12-17 NOTE — Progress Notes (Signed)
CPE- See plan.  Routine anticipatory guidance given to patient.  See health maintenance.  The possibility exists that previously documented standard health maintenance information may have been brought forward from a previous encounter into this note.  If needed, that same information has been updated to reflect the current situation based on today's encounter.    Tetanus 2011 Flu2019 PNA and shingles not due. D/w pt.  Colonoscopy 2011.   Pap not due, d/w pt.  s/p hysterectomy.   DXA not due  Mammogram2019 per patient report.  Living will d/w pt. Husband designated if patient were incapacitated.  Diet and exercise d/w pt. HCV screening done prev.  She hasn't started taking questran yet.  D/w pt.  That was ordered by outside clinic.  Reasonable to try.  Discussed.  H/o renal stones.  D/w pt about possible uro follow up, just on principle.  She can consider.  Using celebrex and tramadol as needed for joint aches.  No ADE on med.  If she stops celebrex she has more aches.    Hypertension:    Using medication without problems or lightheadedness: yes Chest pain with exertion:no Edema:no Short of breath:no Average home BPs: d/w pt about checking BP at home.    Anxiety.  Her mother has health concerns.  Still taking cymbalta and xanax.  Cymbalta helps.  She is sleeping okay but she goes to bed late.    S/p lab band.  Still on PPI.  B12 def, with recent level being a peak lab draw so this is reasonable.    PMH and SH reviewed  Meds, vitals, and allergies reviewed.   ROS: Per HPI.  Unless specifically indicated otherwise in HPI, the patient denies:  General: fever. Eyes: acute vision changes ENT: sore throat Cardiovascular: chest pain Respiratory: SOB GI: vomiting GU: dysuria Musculoskeletal: acute back pain Derm: acute rash Neuro: acute motor dysfunction Psych: worsening mood Endocrine: polydipsia Heme: bleeding Allergy: hayfever  GEN: nad, alert and  oriented HEENT: mucous membranes moist NECK: supple w/o LA CV: rrr. PULM: ctab, no inc wob ABD: soft, +bs EXT: no edema SKIN: no acute rash

## 2017-12-19 NOTE — Assessment & Plan Note (Addendum)
Using celebrex and tramadol as needed for joint aches.  No ADE on med.  If she stops celebrex she has more aches.   Continue as is.  She agrees.  She can check with outside clinic as needed.  See after visit summary.

## 2017-12-19 NOTE — Assessment & Plan Note (Signed)
No change in meds at this point.  She can check her blood pressure at home and update me if persistently elevated.  She agrees.

## 2017-12-19 NOTE — Assessment & Plan Note (Signed)
See above

## 2017-12-19 NOTE — Assessment & Plan Note (Signed)
Living will d/w pt.  Husband designated if patient were incapacitated.  

## 2017-12-19 NOTE — Assessment & Plan Note (Signed)
Tetanus 2011 Flu2019 PNA and shingles not due. D/w pt.  Colonoscopy 2011.   Pap not due, d/w pt.  s/p hysterectomy.   DXA not due  Mammogram2019 per patient report.  Living will d/w pt. Husband designated if patient were incapacitated.  Diet and exercise d/w pt. HCV screening done prev.

## 2017-12-19 NOTE — Assessment & Plan Note (Signed)
Her mother has health concerns and that is a noted stressor for the patient.  Still taking cymbalta and xanax.  Cymbalta helps as does Xanax.  She is sleeping okay but she goes to bed late.   Would continue as is for now.  She is trying to manage her situation with her mother.

## 2017-12-19 NOTE — Assessment & Plan Note (Signed)
History of B12 def, with recent level being a peak lab draw so this is reasonable.   Continue as is.  She agrees.

## 2017-12-21 ENCOUNTER — Encounter: Payer: Self-pay | Admitting: Family Medicine

## 2018-01-28 ENCOUNTER — Other Ambulatory Visit: Payer: Self-pay | Admitting: Family Medicine

## 2018-01-28 NOTE — Telephone Encounter (Signed)
Electronic refill request Celebrex Last refill 11/06/17 #90 Last office visit 12/17/17 See allergy/contraindicaon

## 2018-01-29 NOTE — Telephone Encounter (Signed)
Sent. Thanks.   

## 2018-04-13 ENCOUNTER — Other Ambulatory Visit: Payer: Self-pay | Admitting: Family Medicine

## 2018-04-15 ENCOUNTER — Other Ambulatory Visit: Payer: Self-pay | Admitting: Family Medicine

## 2018-04-15 NOTE — Telephone Encounter (Signed)
Tramadol last filled 11-05-17 #30  Alprazolam last filled 11-05-17 #60  Last OV 12-17-17 Next OV 12-18-17  [facility]

## 2018-04-16 NOTE — Telephone Encounter (Signed)
Sent. Thanks.   

## 2018-08-05 ENCOUNTER — Other Ambulatory Visit: Payer: Self-pay | Admitting: Family Medicine

## 2018-08-05 NOTE — Telephone Encounter (Signed)
Last refilled on 01/29/2018 for #90 with 1 refill. LOV 12/17/17 CPE, future appointment on 12/19/2018

## 2018-08-05 NOTE — Telephone Encounter (Signed)
Sent. Thanks.   

## 2018-09-05 IMAGING — CT CT RENAL STONE PROTOCOL
2 of 4 series · 16 of 46 positions shown, 18 images · non-contrast
Comparison: None.

CLINICAL DATA: Fever and body aches for 4 days. Left flank and back
pain. Mild dysuria.

EXAM:
CT ABDOMEN AND PELVIS WITHOUT CONTRAST
TECHNIQUE: Multidetector CT imaging of the abdomen and pelvis was performed
following the standard protocol without IV contrast.

[Series 3: stone full standard · axial · 0.76mm/px · z∈[+40,+495]mm · 13 of 101 slices shown, 15 images]
[im 5/101  soft-tissue]
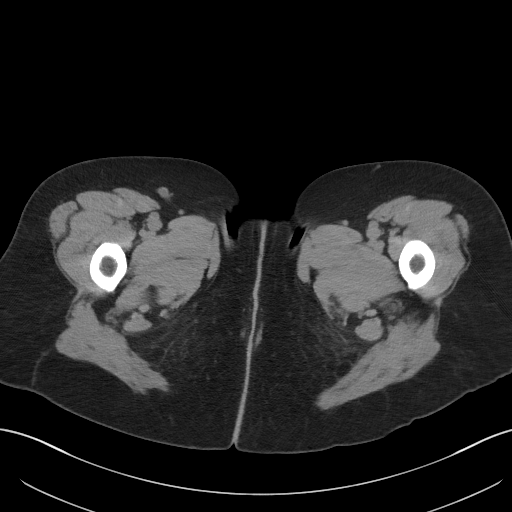
[im 5/101  bone]
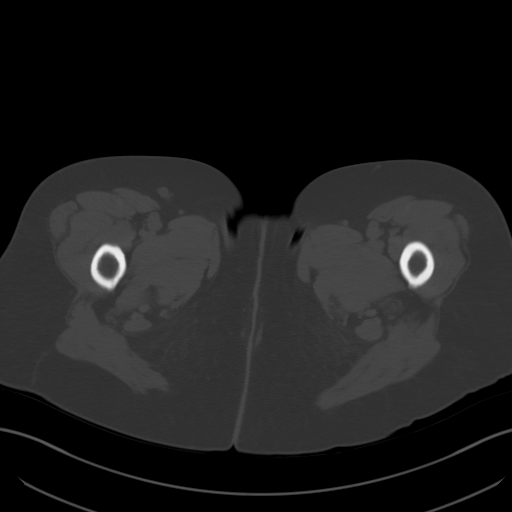
[im 14/101  soft-tissue]
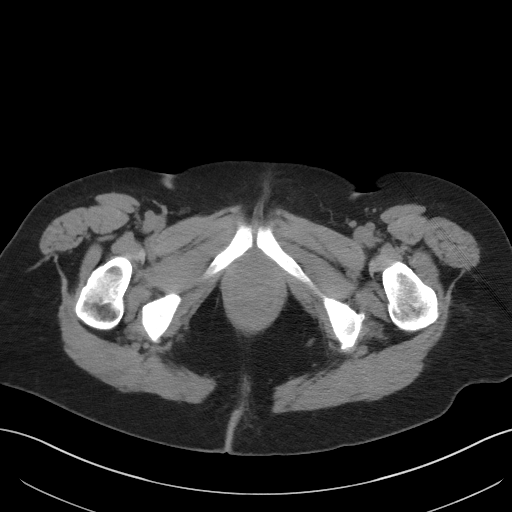
[im 22/101  soft-tissue]
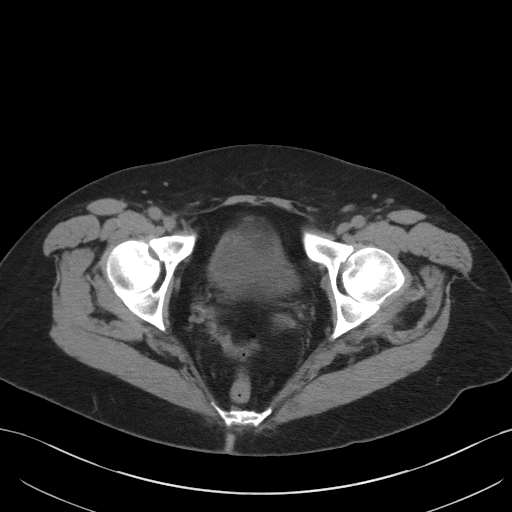
[im 27/101  soft-tissue]
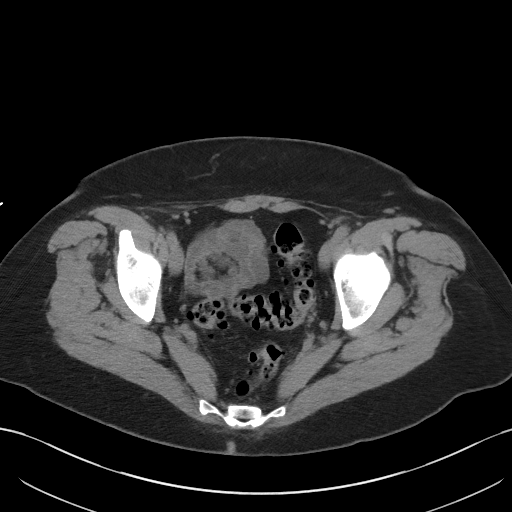
[im 35/101  soft-tissue]
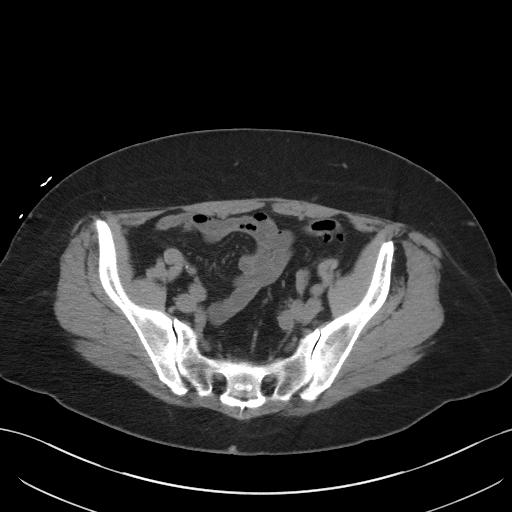
[im 44/101  soft-tissue]
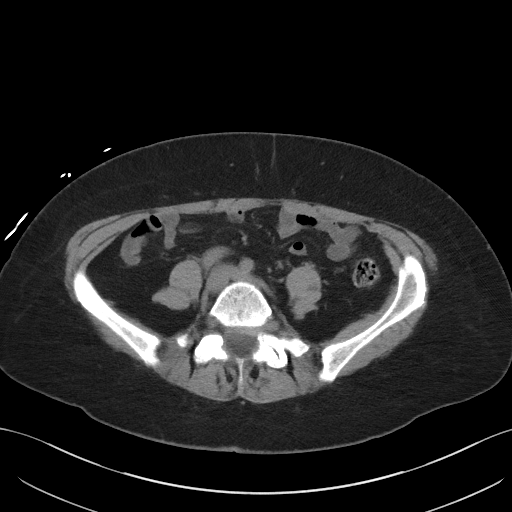
[im 53/101  soft-tissue]
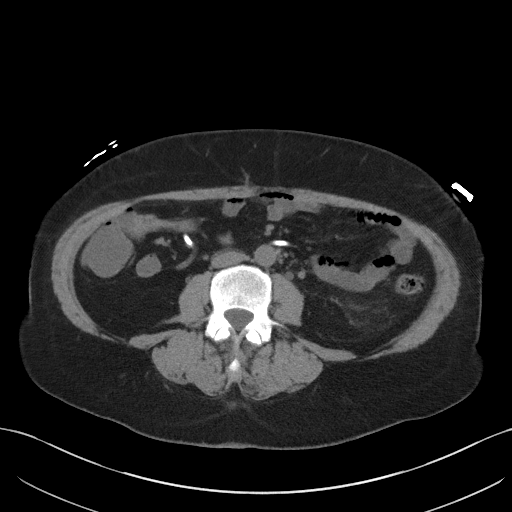
[im 57/101  soft-tissue]
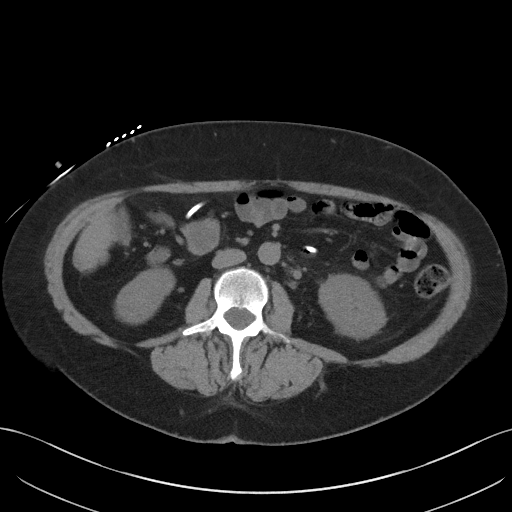
[im 66/101  soft-tissue]
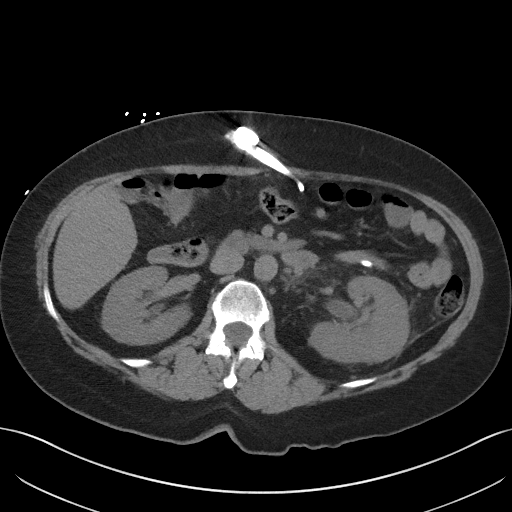
[im 66/101  bone]
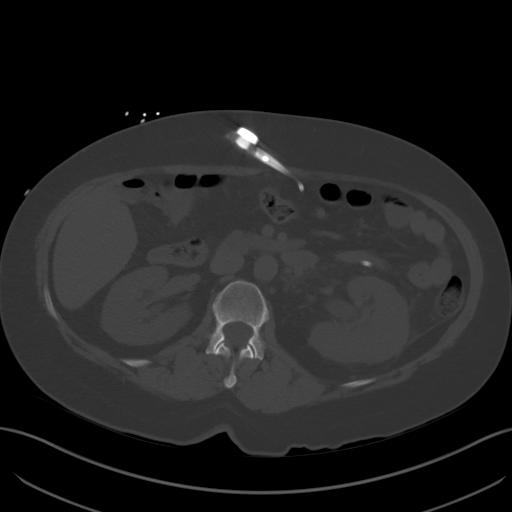
[im 74/101  soft-tissue]
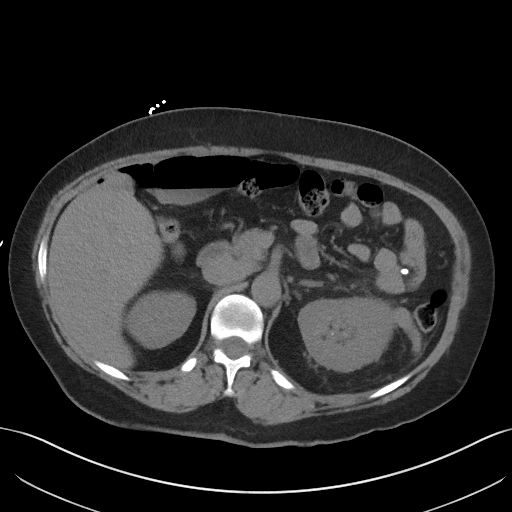
[im 79/101  soft-tissue]
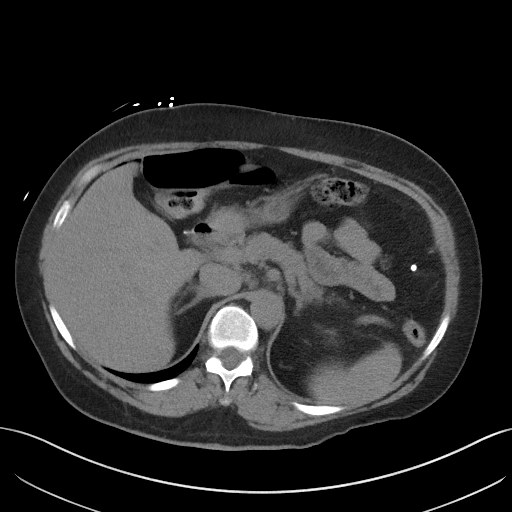
[im 87/101  soft-tissue]
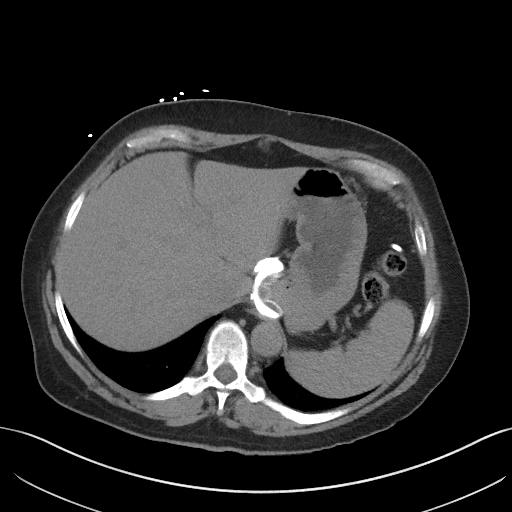
[im 96/101  soft-tissue]
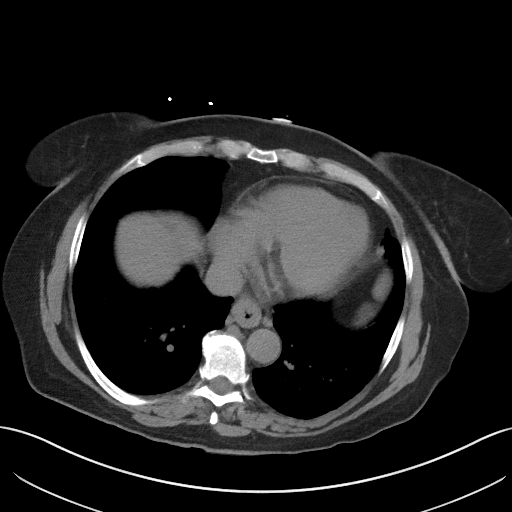

[Series 6: coronal · coronal · 0.86mm/px · 3 of 138 slices shown]
[im 46/138  soft-tissue]
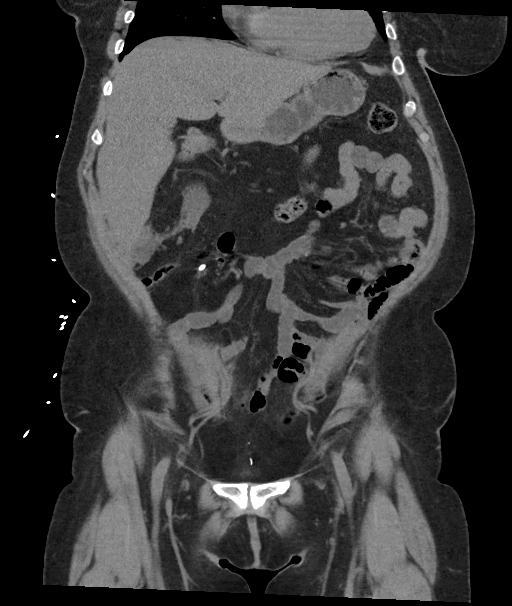
[im 61/138  soft-tissue]
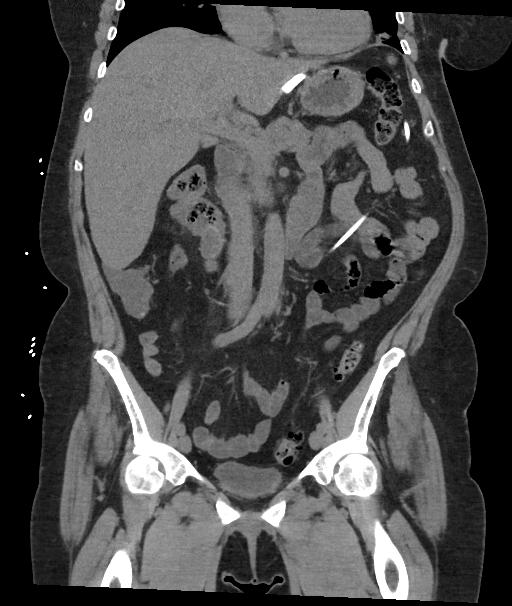
[im 77/138  soft-tissue]
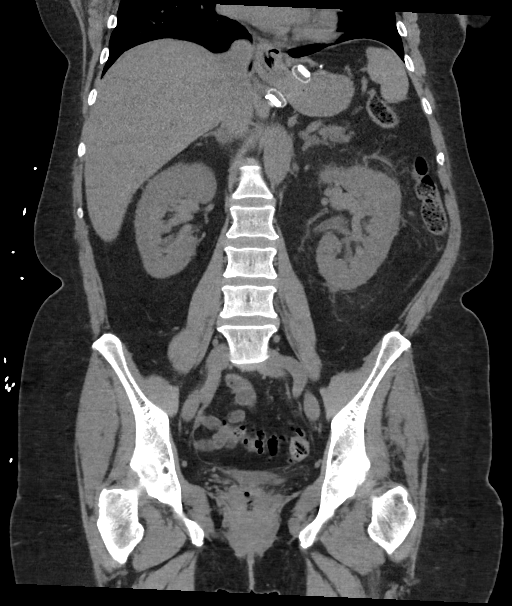

[16 of 46 positions shown; findings below may reference images not displayed]

FINDINGS: Lower chest:  Subsegmental atelectasis noted in the lingula.

Hepatobiliary: No focal abnormality in the liver on this study
without intravenous contrast. Gallbladder surgically absent. No
intrahepatic or extrahepatic biliary dilation.

Pancreas: No focal mass lesion. No dilatation of the main duct. No
intraparenchymal cyst. No peripancreatic edema.

Spleen: No splenomegaly. No focal mass lesion.

Adrenals/Urinary Tract: No adrenal nodule or mass. Right kidney
unremarkable. No right ureteral stone. Several tiny (1-2 mm)
nonobstructing stones are seen in the upper pole the left kidney 2
mm nonobstructing stone identified interpolar left kidney. 2 x 4 x 5
mm stone is identified in the distal left ureter 1-2 cm proximal to
the UVJ. No bladder stones.

Stomach/Bowel: A lap band is identified at the proximal stomach.
There is a tiny associated hiatal hernia. Stomach otherwise
unremarkable. Duodenum is normally positioned as is the ligament of
Treitz. No small bowel wall thickening. No small bowel dilatation.
The terminal ileum is normal. Cecum high in the right upper quadrant
The appendix is normal. Diverticular changes are noted in the left
colon without evidence of diverticulitis.

Vascular/Lymphatic: No abdominal aortic aneurysm. There is no
gastrohepatic or hepatoduodenal ligament lymphadenopathy. No
intraperitoneal or retroperitoneal lymphadenopathy. No pelvic
sidewall lymphadenopathy

Reproductive: Uterus surgically absent.  There is no adnexal mass.

Other: No intraperitoneal free fluid.

Musculoskeletal: Bone windows reveal no worrisome lytic or sclerotic
osseous lesions.
IMPRESSION: 1. 2 x 4 x 5 mm stone distal left ureter about 1-2 cm proximal to
the UVJ. This causes mild left hydroureteronephrosis.
2. Multiple tiny nonobstructing left renal stones.
3. Status post lap band surgery with tiny hiatal hernia evident.

## 2018-10-28 ENCOUNTER — Other Ambulatory Visit: Payer: Self-pay | Admitting: Family Medicine

## 2018-11-04 ENCOUNTER — Other Ambulatory Visit: Payer: Self-pay | Admitting: Family Medicine

## 2018-11-04 NOTE — Telephone Encounter (Signed)
Sent. Thanks.   

## 2018-11-04 NOTE — Telephone Encounter (Signed)
Electronic refill request. Tramadol Last office visit:   12/17/2017   Has upcoming CPE scheduled in December 2020 Last Filled:    30 tablet 1 04/16/2018   Electronic refill request. Alprazolam Last office visit:   12/17/2017   Has CPE scheduled in December 2020 Last Filled:    60 tablet 1 04/16/2018  Please advise.

## 2018-11-15 ENCOUNTER — Ambulatory Visit (INDEPENDENT_AMBULATORY_CARE_PROVIDER_SITE_OTHER): Payer: BC Managed Care – PPO | Admitting: Family Medicine

## 2018-11-15 DIAGNOSIS — R05 Cough: Secondary | ICD-10-CM

## 2018-11-15 DIAGNOSIS — R059 Cough, unspecified: Secondary | ICD-10-CM

## 2018-11-15 MED ORDER — AMOXICILLIN-POT CLAVULANATE 875-125 MG PO TABS: 1.0000 | ORAL_TABLET | Freq: Two times a day (BID) | ORAL | 0 refills | Status: DC

## 2018-11-15 NOTE — Progress Notes (Signed)
Virtual visit completed through WebEx or similar program Patient location: home  Provider location: Financial controller at [facility], office   Pandemic considerations d/w pt.   Limitations and rationale for visit method d/w patient.  Patient agreed to proceed.   CC: cough.    HPI: She inhaled a piece of popcorn about 3 weeks ago, unclear if that is related but she thought that was the triggering event.  She was able to cough that up recently and felt some better after that.    Other sx started about 1 week ago.  She had some aches and then a cough.  Fatigue.  Cough worse about 2 days ago.  R sided lower posterior rib pain recently noted.  Started taking robitussin and drinking water.  Productive cough now, discolored sputum.  Ribs sore from coughing.  Temp 100.3 today, fever started about 3 days ago.  Still fatigued.  HA noted.  Episodic vertigo.  She isn't SOB unless she has a coughing fit.  O2 96% at [facility].  No known covid exposure.  She has been wearing a mask when in public.  covid cautions d/w pt.    Meds and allergies reviewed.   ROS: Per HPI unless specifically indicated in ROS section   NAD Speech wnl Episodic cough during conversation but speaking in complete sentences.    A/P: Cough.  Discussed options.  Rest, fluids, tylenol, and start augmentin.  She agrees.  Routine cautions d/w pt.  She'll update me as needed.  Still okay for outpatient follow-up.

## 2018-11-17 NOTE — Assessment & Plan Note (Signed)
Cough.  Discussed options.  Rest, fluids, tylenol, and start augmentin.  She agrees.  Routine cautions d/w pt.  She'll update me as needed.  Still okay for outpatient follow-up.

## 2018-11-20 ENCOUNTER — Telehealth: Payer: Self-pay

## 2018-11-20 MED ORDER — HYDROCODONE-HOMATROPINE 5-1.5 MG/5ML PO SYRP: 5.0000 mL | ORAL_SOLUTION | Freq: Three times a day (TID) | ORAL | 0 refills | Status: DC | PRN

## 2018-11-20 NOTE — Telephone Encounter (Signed)
With no fever, normal pulse ox and pulse, and already on abx, then I think it makes sense to use hycodan in the meantime, continue abx, and given this a little more time.  That is assuming she gradually improves and doesn't worsen.  If she is more SOB/worsening, then recheck/eval makes sense.  I think that would be the best plan at this point.  Please have her update me tomorrow.  Thanks.

## 2018-11-20 NOTE — Telephone Encounter (Signed)
Pt notified as instructed by phone.  Verbalizes understanding.  

## 2018-11-20 NOTE — Telephone Encounter (Signed)
Pt had virtual visit on 11/15/18; pt has taken abx; no fever now; has dry cough, h/a,ribs hurt from coughing; pt is not sleeping at night due to cough. Tightness in chest and sometimes SOB due to so much coughing. Pt said "she has coughed herself to death". Pt coughed entire call but was able to speak in complete sentences.pt does not feel like she is any distress and does not need to go to [facility] or ED.  Pt has tried hot tea and lemon, robitussin DM and tessalon perle and none help the cough. Pt request hydrocodone cough med. I spoke with pt; pt feels like all this started by aspirating a piece of popcorn few wks ago. Pulse ox is 97 % and P 69.  Pt has had hx of pneumonia 3 times and each time pt coughed for 3 months. [facility]. Offered virtual appt and pt asked to send the note to provider to see if needs anything beside stronger cough med. Pt said she does not think she has covid but will get covid test or CXR if provider thinks necessary. Dr Damita Dunnings is out of office but may look at desktop and am sending to Dr Darnell Level who is also in office in case pt needs covid testing which ends today at 3 pm. Pt request cb after review.

## 2018-11-20 NOTE — Telephone Encounter (Signed)
Chart reviewed. I have sent in hycodan cough syrup. Don't mix with her tramadol.  Will defer to PCP whether further eval necessary.

## 2018-12-12 LAB — HM MAMMOGRAPHY

## 2018-12-16 ENCOUNTER — Other Ambulatory Visit: Payer: Self-pay

## 2018-12-16 ENCOUNTER — Other Ambulatory Visit (INDEPENDENT_AMBULATORY_CARE_PROVIDER_SITE_OTHER): Payer: BC Managed Care – PPO

## 2018-12-16 ENCOUNTER — Other Ambulatory Visit: Payer: Self-pay | Admitting: Family Medicine

## 2018-12-16 DIAGNOSIS — I10 Essential (primary) hypertension: Secondary | ICD-10-CM | POA: Diagnosis not present

## 2018-12-16 DIAGNOSIS — E538 Deficiency of other specified B group vitamins: Secondary | ICD-10-CM

## 2018-12-16 LAB — VITAMIN B12: Vitamin B-12: 771 pg/mL (ref 211–911)

## 2018-12-16 LAB — COMPREHENSIVE METABOLIC PANEL
ALT: 14 U/L (ref 0–35)
AST: 21 U/L (ref 0–37)
Albumin: 4.2 g/dL (ref 3.5–5.2)
Alkaline Phosphatase: 110 U/L (ref 39–117)
BUN: 15 mg/dL (ref 6–23)
CO2: 29 mEq/L (ref 19–32)
Calcium: 9.4 mg/dL (ref 8.4–10.5)
Chloride: 105 mEq/L (ref 96–112)
Creatinine, Ser: 0.72 mg/dL (ref 0.40–1.20)
GFR: 82.69 mL/min (ref >60.00)
Glucose, Bld: 89 mg/dL (ref 70–99)
Potassium: 4 mEq/L (ref 3.5–5.1)
Sodium: 141 mEq/L (ref 135–145)
Total Bilirubin: 0.6 mg/dL (ref 0.2–1.2)
Total Protein: 7.1 g/dL (ref 6.0–8.3)

## 2018-12-16 LAB — CBC WITH DIFFERENTIAL/PLATELET
Basophils Absolute: 0.1 10*3/uL (ref 0.0–0.1)
Basophils Relative: 1 % (ref 0.0–3.0)
Eosinophils Absolute: 0.2 10*3/uL (ref 0.0–0.7)
Eosinophils Relative: 4.1 % (ref 0.0–5.0)
HCT: 44.8 % (ref 36.0–46.0)
Hemoglobin: 14.9 g/dL (ref 12.0–15.0)
Lymphocytes Relative: 35.6 % (ref 12.0–46.0)
Lymphs Abs: 2 10*3/uL (ref 0.7–4.0)
MCHC: 33.2 g/dL (ref 30.0–36.0)
MCV: 95.1 fl (ref 78.0–100.0)
Monocytes Absolute: 0.5 10*3/uL (ref 0.1–1.0)
Monocytes Relative: 8.3 % (ref 3.0–12.0)
Neutro Abs: 2.9 10*3/uL (ref 1.4–7.7)
Neutrophils Relative %: 51 % (ref 43.0–77.0)
Platelets: 215 10*3/uL (ref 150.0–400.0)
RBC: 4.7 Mil/uL (ref 3.87–5.11)
RDW: 13.2 % (ref 11.5–15.5)
WBC: 5.7 10*3/uL (ref 4.0–10.5)

## 2018-12-16 LAB — LIPID PANEL
Cholesterol: 215 mg/dL — ABNORMAL HIGH (ref 0–200)
HDL: 71 mg/dL (ref >39.00)
LDL Cholesterol: 133 mg/dL — ABNORMAL HIGH (ref 0–99)
NonHDL: 144.28
Total CHOL/HDL Ratio: 3
Triglycerides: 58 mg/dL (ref 0.0–149.0)
VLDL: 11.6 mg/dL (ref 0.0–40.0)

## 2018-12-19 ENCOUNTER — Other Ambulatory Visit: Payer: Self-pay

## 2018-12-19 ENCOUNTER — Encounter: Payer: Self-pay | Admitting: Family Medicine

## 2018-12-19 ENCOUNTER — Ambulatory Visit (INDEPENDENT_AMBULATORY_CARE_PROVIDER_SITE_OTHER): Payer: BC Managed Care – PPO | Admitting: Family Medicine

## 2018-12-19 VITALS — BP 142/80 | HR 61 | Temp 97.5°F | Ht 66.0 in | Wt 176.4 lb

## 2018-12-19 DIAGNOSIS — Z0000 Encounter for general adult medical examination without abnormal findings: Secondary | ICD-10-CM | POA: Diagnosis not present

## 2018-12-19 DIAGNOSIS — Z7189 Other specified counseling: Secondary | ICD-10-CM

## 2018-12-19 DIAGNOSIS — M158 Other polyosteoarthritis: Secondary | ICD-10-CM

## 2018-12-19 DIAGNOSIS — E538 Deficiency of other specified B group vitamins: Secondary | ICD-10-CM

## 2018-12-19 DIAGNOSIS — Z9884 Bariatric surgery status: Secondary | ICD-10-CM

## 2018-12-19 DIAGNOSIS — R059 Cough, unspecified: Secondary | ICD-10-CM

## 2018-12-19 DIAGNOSIS — F411 Generalized anxiety disorder: Secondary | ICD-10-CM

## 2018-12-19 DIAGNOSIS — R05 Cough: Secondary | ICD-10-CM

## 2018-12-19 MED ORDER — PANTOPRAZOLE SODIUM 40 MG PO TBEC: DELAYED_RELEASE_TABLET | ORAL | 3 refills | Status: DC

## 2018-12-19 MED ORDER — DULOXETINE HCL 20 MG PO CPEP: ORAL_CAPSULE | ORAL | 3 refills | Status: DC

## 2018-12-19 NOTE — Progress Notes (Signed)
This visit occurred during the SARS-CoV-2 public health emergency.  Safety protocols were in place, including screening questions prior to the visit, additional usage of staff PPE, and extensive cleaning of exam room while observing appropriate contact time as indicated for disinfecting solutions.  CPE- See plan.  Routine anticipatory guidance given to patient.  See health maintenance.  The possibility exists that previously documented standard health maintenance information may have been brought forward from a previous encounter into this note.  If needed, that same information has been updated to reflect the current situation based on today's encounter.    [facility] not due shingles d/w pt.  Colonoscopy 2011.  Pap not due, d/w pt. s/p hysterectomy.   DXA not due  Mammogram 2020  Living will d/w pt. Husband designated if patient were incapacitated.  Diet and exercise d/w pt. HCV screening done prev.  HCTZ used to control nocturia, less for BP.  BP is controlled.  No ADE on med.   Cough clearly improved from previous.  Resolved.  B12 def.  Still on replacement.  B12 wnl.  No numbness or tingling.  Labs d/w pt.     Anxiety.  Still on baseline meds. No ADE on med.  Taking BZD prn qhs, 1/2 tab at night, it helps. Her sx tend to be worse at night, waking up worrying at night w/o med use.  No SI/HI.    Joint pain.  Taking tramadol prn.  No ADE on med.  She is still having diffuse joint aches, esp B hands and L hip pain.  She is putting up with discomfort at baseline and she deferred ortho eval at this point.    GERD/band hx noted.  Still on PPI daily.  Sx controlled now.  She has more sx earlier in 2020.  PMH and SH reviewed  Meds, vitals, and allergies reviewed.   ROS: Per HPI.  Unless specifically indicated otherwise in HPI, the patient denies:  General: fever. Eyes: acute vision changes ENT: sore throat Cardiovascular: chest pain Respiratory:  SOB GI: vomiting GU: dysuria Musculoskeletal: acute back pain Derm: acute rash Neuro: acute motor dysfunction Psych: worsening mood Endocrine: polydipsia Heme: bleeding Allergy: hayfever  GEN: nad, alert and oriented HEENT: ncat NECK: supple w/o LA CV: rrr. PULM: ctab, no inc wob ABD: soft, +bs EXT: no edema SKIN: no acute rash

## 2018-12-19 NOTE — Patient Instructions (Addendum)
Check with your insurance to see if they will cover the shingles shot. Update me as needed.  Take care.  Glad to see you.   Thanks for getting a flu shot.

## 2018-12-22 NOTE — Assessment & Plan Note (Signed)
Resolved.  Lungs are clear.

## 2018-12-22 NOTE — Assessment & Plan Note (Signed)
Living will d/w pt.  Husband designated if patient were incapacitated.  

## 2018-12-22 NOTE — Assessment & Plan Note (Signed)
Taking tramadol prn.  No ADE on med.  She is still having diffuse joint aches, esp B hands and L hip pain.  She is putting up with discomfort at baseline and she deferred ortho eval at this point.   Continue as is.  She will update me as needed.

## 2018-12-22 NOTE — Assessment & Plan Note (Signed)
Still on baseline meds. No ADE on med.  Taking BZD prn qhs, 1/2 tab at night, it helps. Her sx tend to be worse at night, waking up worrying at night w/o med use.  No SI/HI.   Continue as is.  She agrees.

## 2018-12-22 NOTE — Assessment & Plan Note (Signed)
Still on replacement.  B12 wnl.  No numbness or tingling.  Labs d/w pt.    continue as is.  She agrees.

## 2018-12-22 NOTE — Assessment & Plan Note (Signed)
[facility] not due shingles d/w pt.  Colonoscopy 2011.  Pap not due, d/w pt. s/p hysterectomy.   DXA not due  Mammogram 2020  Living will d/w pt. Husband designated if patient were incapacitated.  Diet and exercise d/w pt. HCV screening done prev.

## 2018-12-22 NOTE — Assessment & Plan Note (Signed)
GERD/band hx noted.  Still on PPI daily.  Sx controlled now.  She has more sx earlier in 2020.  She will update me as needed.  Continue PPI given her history.

## 2018-12-31 ENCOUNTER — Encounter: Payer: Self-pay | Admitting: Family Medicine

## 2019-01-28 ENCOUNTER — Other Ambulatory Visit: Payer: Self-pay | Admitting: Family Medicine

## 2019-01-28 NOTE — Telephone Encounter (Signed)
Electronic refill request. Celebrex Last office visit:   12/19/2018 Last Filled:    90 capsule 1 08/05/2018   Please advise.

## 2019-01-29 NOTE — Telephone Encounter (Signed)
Sent. Thanks.   

## 2019-01-31 ENCOUNTER — Other Ambulatory Visit: Payer: Self-pay | Admitting: Family Medicine

## 2019-01-31 NOTE — Telephone Encounter (Signed)
Electronic refill request. Alprazolam Last office visit:   12/19/2018 Last Filled:     60 tablet 0 11/04/2018  Please advise.

## 2019-02-02 NOTE — Telephone Encounter (Signed)
Sent. Thanks.   

## 2019-03-12 ENCOUNTER — Other Ambulatory Visit: Payer: Self-pay | Admitting: Family Medicine

## 2019-03-28 ENCOUNTER — Ambulatory Visit: Payer: Self-pay | Attending: Internal Medicine

## 2019-03-28 DIAGNOSIS — Z23 Encounter for immunization: Secondary | ICD-10-CM

## 2019-03-28 NOTE — Progress Notes (Signed)
   Covid-19 Vaccination Clinic  Name:  Tiffany Campos    MRN: [id number] DOB: [birth year]  03/28/2019  Tiffany Campos was observed post Covid-19 immunization for 15 minutes without incident. She was provided with Vaccine Information Sheet and instruction to access the V-Safe system.   Tiffany Campos was instructed to call 911 with any severe reactions post vaccine: Marland Kitchen Difficulty breathing  . Swelling of face and throat  . A fast heartbeat  . A bad rash all over body  . Dizziness and weakness   Immunizations Administered    Name Date Dose VIS Date Route   Pfizer COVID-19 Vaccine 03/28/2019  1:00 PM 0.3 mL 12/13/2018 Intramuscular   Manufacturer: [employer], [employer]   Lot: [id number]   NDC: [address]-4

## 2019-04-18 ENCOUNTER — Ambulatory Visit: Payer: Self-pay | Attending: Internal Medicine

## 2019-04-18 DIAGNOSIS — Z23 Encounter for immunization: Secondary | ICD-10-CM

## 2019-04-18 NOTE — Progress Notes (Signed)
   Covid-19 Vaccination Clinic  Name:  Tiffany Campos    MRN: [id number] DOB: 04-01-1960  04/18/2019  [name] was observed post Covid-19 immunization for 15 minutes without incident. She was provided with Vaccine Information Sheet and instruction to access the V-Safe system.   [name] was instructed to call 911 with any severe reactions post vaccine: Marland Kitchen Difficulty breathing  . Swelling of face and throat  . A fast heartbeat  . A bad rash all over body  . Dizziness and weakness   Immunizations Administered    Name Date Dose VIS Date Route   Pfizer COVID-19 Vaccine 04/18/2019 11:58 AM 0.3 mL 12/13/2018 Intramuscular   Manufacturer: [employer], [employer]   Lot: [id number]   NDC: [address]-9

## 2019-05-06 ENCOUNTER — Telehealth: Payer: Self-pay

## 2019-05-06 ENCOUNTER — Other Ambulatory Visit: Payer: Self-pay | Admitting: Family Medicine

## 2019-05-06 NOTE — Telephone Encounter (Signed)
Okay to continue.  Sent.  Thanks. 

## 2019-05-06 NOTE — Telephone Encounter (Signed)
Last filled on 11/04/2018 #30 with 1 refill  LOV 12/19/2018 CPE. No future appointments scheduled

## 2019-05-06 NOTE — Telephone Encounter (Signed)
Received fax from [employer] in regards to Tramadol. It states " patient indicated that they need a medication exception for Tramadol, this has been approved for dates 05/02/19-07/31/2019."  Placing form from insurance on your desk for review FYI to Dr Para March. Patient advised.

## 2019-05-06 NOTE — Telephone Encounter (Signed)
Noted. Thanks.

## 2019-07-24 ENCOUNTER — Other Ambulatory Visit: Payer: Self-pay | Admitting: Family Medicine

## 2019-08-13 ENCOUNTER — Telehealth: Payer: Self-pay | Admitting: Physical Medicine and Rehabilitation

## 2019-08-13 NOTE — Telephone Encounter (Signed)
Called patient to get more information about where her pain is and what type of injection she is requesting. Had bilateral L4-5 and L-5-S1 MBB on 09/13/17 and RFA was discussed as the next step.

## 2019-08-13 NOTE — Telephone Encounter (Signed)
Patient called requesting a call back to schedule hip injections. Please call patient at [phone].

## 2019-08-13 NOTE — Telephone Encounter (Signed)
Patient returned call to [facility]. Patient states she didn't answer because it says spam. Please call patient back at [phone]

## 2019-08-14 ENCOUNTER — Other Ambulatory Visit: Payer: Self-pay | Admitting: Physical Medicine and Rehabilitation

## 2019-08-14 MED ORDER — PREDNISONE 50 MG PO TABS: ORAL_TABLET | ORAL | 0 refills | Status: DC

## 2019-08-14 NOTE — Telephone Encounter (Signed)
See previous message

## 2019-08-14 NOTE — Telephone Encounter (Signed)
Patient states pain is in her hips. I tried to ask where in the hip the pain is, asked about groin pain, etc. She says the pain is not in her groin but in the front "right in the joint." I offered her an OV to discuss on 8/24. She states that she has been dealing with this pain for 2 weeks and will be out of town on 8/24. I offered to see if another provider could see her sooner. She declined. She is asking if you can send a prescription for prednisone. Pharmacy is correct.

## 2019-08-14 NOTE — Progress Notes (Signed)
Short course 50mg  prednisone for back pain flare up, see notes

## 2019-08-14 NOTE — Telephone Encounter (Signed)
Done

## 2019-08-15 ENCOUNTER — Other Ambulatory Visit: Payer: Self-pay | Admitting: Family Medicine

## 2019-08-15 NOTE — Telephone Encounter (Signed)
Electronic refill request. Tramadol Last office visit:   12/19/2018 Last Filled:    30 tablet 1 05/06/2019  Please advise.

## 2019-08-18 NOTE — Telephone Encounter (Signed)
Sent. Thanks.   

## 2019-08-23 ENCOUNTER — Other Ambulatory Visit: Payer: Self-pay | Admitting: Family Medicine

## 2019-08-25 NOTE — Telephone Encounter (Signed)
Electronic refill request. Vit B12 inj Last office visit:   12/19/2018 Last Filled:    6 mL 1 03/12/2019  Last B 12 was 8 months ago.

## 2019-08-25 NOTE — Telephone Encounter (Signed)
Would continue, due for CPE in 01/2020.  Sent. Thanks.

## 2019-08-25 NOTE — Telephone Encounter (Signed)
Patient advised.

## 2019-10-01 ENCOUNTER — Ambulatory Visit: Payer: Self-pay

## 2019-10-01 ENCOUNTER — Encounter: Payer: Self-pay | Admitting: Orthopaedic Surgery

## 2019-10-01 ENCOUNTER — Other Ambulatory Visit: Payer: Self-pay

## 2019-10-01 ENCOUNTER — Ambulatory Visit (INDEPENDENT_AMBULATORY_CARE_PROVIDER_SITE_OTHER): Payer: 59 | Admitting: Orthopaedic Surgery

## 2019-10-01 VITALS — Ht 66.0 in | Wt 175.0 lb

## 2019-10-01 DIAGNOSIS — M545 Low back pain: Secondary | ICD-10-CM

## 2019-10-01 DIAGNOSIS — G8929 Other chronic pain: Secondary | ICD-10-CM

## 2019-10-01 DIAGNOSIS — M25551 Pain in right hip: Secondary | ICD-10-CM | POA: Diagnosis not present

## 2019-10-01 DIAGNOSIS — M7061 Trochanteric bursitis, right hip: Secondary | ICD-10-CM | POA: Diagnosis not present

## 2019-10-01 MED ORDER — LIDOCAINE HCL 1 % IJ SOLN
2.0000 mL | INTRAMUSCULAR | Status: AC | PRN
  Administered 2019-10-01: 2 mL

## 2019-10-01 MED ORDER — METHYLPREDNISOLONE ACETATE 40 MG/ML IJ SUSP
80.0000 mg | INTRAMUSCULAR | Status: AC | PRN
  Administered 2019-10-01: 80 mg via INTRA_ARTICULAR

## 2019-10-01 MED ORDER — BUPIVACAINE HCL 0.5 % IJ SOLN
2.0000 mL | INTRAMUSCULAR | Status: AC | PRN
  Administered 2019-10-01: 2 mL via INTRA_ARTICULAR

## 2019-10-01 NOTE — Progress Notes (Signed)
Office Visit Note   [name]           Date of Birth: [birth year]           MRN: [id number] Visit Date: 10/01/2019              Requested by: Joaquim Nam, MD [address] [place],  [place] [address] PCP: Joaquim Nam, MD   Assessment & Plan: Visit Diagnoses:  1. Pain in right hip   2. Chronic bilateral low back pain without sciatica   3. Trochanteric bursitis, right hip     Plan: Tiffany Campos has history of chronic low back pain and SI joint discomfort.  She is seeing Dr. Alvester Morin in the past for facet injections and SI joint injections.  Within the past year she has had some "worsening" of pain in her superior buttock, lateral right hip and groin.  Climbing stairs seems to make it worse.  She has had some pain along the lateral aspect of her right thigh as well associated with some tingling in the lateral leg.  She has difficulty sleeping on her right side with some burning but not on the left.  X-rays revealed facet joint changes at L4-5 and L5-S1 with anterior listhesis at both levels.  This certainly could be the origin of her pain but she is also quite tender over the greater trochanter of her right hip.  I will inject the greater trochanter of the right hip with cortisone and monitor response of.  If no improvement will obtain a new MRI scan of her lumbar spine.  The last scan was in 2017 without evidence of stenosis  Follow-Up Instructions: Return if symptoms worsen or fail to improve.   Orders:  Orders Placed This Encounter  Procedures   Large Joint Inj: R greater trochanter   XR Lumbar Spine 2-3 Views   XR HIP UNILAT W OR W/O PELVIS 1V RIGHT   No orders of the defined types were placed in this encounter.     Procedures: Large Joint Inj: R greater trochanter on 10/01/2019 11:57 AM Indications: pain and diagnostic evaluation Details: 25 G 1.5 in needle  Arthrogram: No  Medications: 2 mL lidocaine 1 %; 2 mL bupivacaine 0.5 %; 80 mg  methylPREDNISolone acetate 40 MG/ML Procedure, treatment alternatives, risks and benefits explained, specific risks discussed. Consent was given by the patient. Immediately prior to procedure a time out was called to verify the correct patient, procedure, equipment, support staff and site/side marked as required. Patient was prepped and draped in the usual sterile fashion.       Clinical Data: No additional findings.   Subjective: Chief Complaint  Patient presents with   Right Hip - Pain  Patient presents today for right hip pain. She states that she has had lower back pain for years that radiates into her hips, but this pain is new and different. She is having lateral right hip pain that radiates into her groin. She has increased pain with trying to climb stairs. Her right leg feels weak at times. Some numbness and tingling in her lateral right leg and into her calf. She walks with a limp and states that it seems more pronounced with longer strides. She takes Celebrex daily, and occasionally a Tramadol if needed. She has to lay on her side with a pillow between her thighs, or else the pain is worse without a pillow.  HPI  Review of Systems   Objective: Vital Signs:  Ht 5\' 6"  (1.676 m)    Wt 175 lb (79.4 kg)    BMI 28.25 kg/m   Physical Exam Constitutional:      Appearance: She is well-developed.  Eyes:     Pupils: Pupils are equal, round, and reactive to light.  Pulmonary:     Effort: Pulmonary effort is normal.  Skin:    General: Skin is warm and dry.  Neurological:     Mental Status: She is alert and oriented to person, place, and time.  Psychiatric:        Behavior: Behavior normal.     Ortho Exam awake alert and oriented x3.  Comfortable sitting.  Does not have a limp.  Painless range of motion of both hips with internal and external rotation.  No loss of motion.  Does have some tenderness directly over the greater trochanter of the left hip without any skin changes.   Mild percussible tenderness of the lumbar spine the patient relates is unchanged from prior evaluations.  No pain in either SI joint Specialty Comments:  No specialty comments available.  Imaging: XR HIP UNILAT W OR W/O PELVIS 1V RIGHT  Result Date: 10/01/2019 AP pelvis and lateral of the right hip are obtained in.  Joint spaces are well-maintained in both hip joints.  There might be some very small subchondral cysts on the femoral heads.  No ectopic calcification.  Some sclerosis of the SI joints more on the left than the right  XR Lumbar Spine 2-3 Views  Result Date: 10/01/2019 Films of the lumbar spine demonstrate facet sclerosis at L4-5 and L5-S1.  Slight anterior listhesis of L 4 on 5 and L5 on S1.   There is some decrease in the disc space between L5 and S1.  Prior lap band gastric procedure with visible tubing    PMFS History: Patient Active Problem List   Diagnosis Date Noted   Trochanteric bursitis, right hip 10/01/2019   Cough 04/04/2017   Ureteral calculus 12/24/2016   Arthritis of carpometacarpal (CMC) joints of both thumbs 02/29/2016   Edema 12/14/2015   Low back pain 11/05/2015   Fatigue 07/21/2015   B12 deficiency 07/21/2015   Routine general medical examination at a health care facility 11/11/2014   Advance care planning 11/11/2014   Rash and nonspecific skin eruption 11/11/2014   Allergic dermatitis 11/03/2014   Essential hypertension 11/06/2013   OA (osteoarthritis) 11/06/2013   Generalized anxiety disorder 11/06/2013   GERD (gastroesophageal reflux disease) 11/06/2013   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 03/13/2013   Past Medical History:  Diagnosis Date   Anxiety    Arthritis    Diverticulosis    GERD (gastroesophageal reflux disease)    Hyperlipidemia    Hypertension    Joint pain    Kidney stones    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, minmal residual sx   Stress incontinence     Family History    Problem Relation Age of Onset   Peripheral vascular disease Father    GER disease Father    Diabetes Mother    Hypertension Mother    Arthritis Mother    GER disease Mother    Cancer Mother        carcinoid tumor   Colon cancer Maternal Uncle        possible colon cancer dx   Breast cancer Neg Hx     Past Surgical History:  Procedure Laterality Date   ABDOMINAL HYSTERECTOMY  CHOLECYSTECTOMY     CYSTOSCOPY W/ URETERAL STENT PLACEMENT Left 12/18/2016   Procedure: CYSTOSCOPY WITH RETROGRADE PYELOGRAM/URETERAL STENT PLACEMENT;  Surgeon: Jerilee Field, MD;  Location: [facility];  Service: Urology;  Laterality: Left;   CYSTOSCOPY W/ URETERAL STENT PLACEMENT Left 12/29/2016   Procedure: CYSTOSCOPY WITH STENT REPLACEMENT;  Surgeon: Riki Altes, MD;  Location: [facility];  Service: Urology;  Laterality: Left;   CYSTOSCOPY/RETROGRADE/URETEROSCOPY/STONE EXTRACTION WITH BASKET Left 12/29/2016   Procedure: CYSTOSCOPY/RETROGRADE/URETEROSCOPY/STONE EXTRACTION WITH BASKET;  Surgeon: Riki Altes, MD;  Location: [facility];  Service: Urology;  Laterality: Left;   ECTOPIC PREGNANCY SURGERY     LAPAROSCOPIC GASTRIC BANDING  08/03/2009   ROTATOR CUFF REPAIR  05-25-11   right, with distal clavicle resection   SHOULDER ARTHROSCOPY Right    tmj     TONSILLECTOMY AND ADENOIDECTOMY     Social History   Occupational History   Not on file  Tobacco Use   Smoking status: Never Smoker   Smokeless tobacco: Never Used  Substance and Sexual Activity   Alcohol use: Yes    Comment: 1-2 glasses a day.    Drug use: No   Sexual activity: Not on file

## 2019-10-22 ENCOUNTER — Other Ambulatory Visit: Payer: Self-pay | Admitting: Family Medicine

## 2019-10-26 ENCOUNTER — Other Ambulatory Visit: Payer: Self-pay | Admitting: Family Medicine

## 2019-10-27 NOTE — Telephone Encounter (Signed)
Electronic refill request. Celebrex Last office visit:   12/19/2018 Last Filled:    90 capsule 1 07/24/2019   Electronic refill request. Alprazolam Last office visit:   12/19/2018 Last Filled:    60 tablet 0 02/02/2019

## 2019-10-28 NOTE — Telephone Encounter (Signed)
Sent. Thanks.   

## 2019-11-25 ENCOUNTER — Telehealth: Payer: Self-pay

## 2019-11-25 NOTE — Telephone Encounter (Signed)
Fax received from pharmacy reporting needing PA for tramadol. Started PA on [email or web address]  PA pending

## 2019-12-02 NOTE — Telephone Encounter (Signed)
Approved from 11-25-19 to 02-23-20

## 2019-12-10 ENCOUNTER — Other Ambulatory Visit: Payer: Self-pay | Admitting: Family Medicine

## 2019-12-10 NOTE — Telephone Encounter (Signed)
Please advise 

## 2019-12-10 NOTE — Telephone Encounter (Signed)
Sent. Thanks.   

## 2019-12-29 ENCOUNTER — Other Ambulatory Visit: Payer: Self-pay | Admitting: Family Medicine

## 2019-12-29 DIAGNOSIS — I10 Essential (primary) hypertension: Secondary | ICD-10-CM

## 2019-12-29 DIAGNOSIS — E538 Deficiency of other specified B group vitamins: Secondary | ICD-10-CM

## 2020-01-01 LAB — HM MAMMOGRAPHY

## 2020-01-08 ENCOUNTER — Other Ambulatory Visit: Payer: Self-pay

## 2020-01-08 ENCOUNTER — Other Ambulatory Visit (INDEPENDENT_AMBULATORY_CARE_PROVIDER_SITE_OTHER): Payer: 59

## 2020-01-08 DIAGNOSIS — I10 Essential (primary) hypertension: Secondary | ICD-10-CM

## 2020-01-08 DIAGNOSIS — E538 Deficiency of other specified B group vitamins: Secondary | ICD-10-CM

## 2020-01-08 LAB — CBC WITH DIFFERENTIAL/PLATELET
Basophils Absolute: 0.1 10*3/uL (ref 0.0–0.1)
Basophils Relative: 1.8 % (ref 0.0–3.0)
Eosinophils Absolute: 0.2 10*3/uL (ref 0.0–0.7)
Eosinophils Relative: 3.6 % (ref 0.0–5.0)
HCT: 45.9 % (ref 36.0–46.0)
Hemoglobin: 15.4 g/dL — ABNORMAL HIGH (ref 12.0–15.0)
Lymphocytes Relative: 32.1 % (ref 12.0–46.0)
Lymphs Abs: 1.7 10*3/uL (ref 0.7–4.0)
MCHC: 33.6 g/dL (ref 30.0–36.0)
MCV: 96 fl (ref 78.0–100.0)
Monocytes Absolute: 0.5 10*3/uL (ref 0.1–1.0)
Monocytes Relative: 9.1 % (ref 3.0–12.0)
Neutro Abs: 2.8 10*3/uL (ref 1.4–7.7)
Neutrophils Relative %: 53.4 % (ref 43.0–77.0)
Platelets: 209 10*3/uL (ref 150.0–400.0)
RBC: 4.78 Mil/uL (ref 3.87–5.11)
RDW: 12.9 % (ref 11.5–15.5)
WBC: 5.2 10*3/uL (ref 4.0–10.5)

## 2020-01-08 LAB — COMPREHENSIVE METABOLIC PANEL
ALT: 18 U/L (ref 0–35)
AST: 28 U/L (ref 0–37)
Albumin: 4.3 g/dL (ref 3.5–5.2)
Alkaline Phosphatase: 88 U/L (ref 39–117)
BUN: 17 mg/dL (ref 6–23)
CO2: 31 mEq/L (ref 19–32)
Calcium: 9.5 mg/dL (ref 8.4–10.5)
Chloride: 103 mEq/L (ref 96–112)
Creatinine, Ser: 0.75 mg/dL (ref 0.40–1.20)
GFR: 86.27 mL/min (ref >60.00)
Glucose, Bld: 93 mg/dL (ref 70–99)
Potassium: 4.4 mEq/L (ref 3.5–5.1)
Sodium: 140 mEq/L (ref 135–145)
Total Bilirubin: 1 mg/dL (ref 0.2–1.2)
Total Protein: 6.6 g/dL (ref 6.0–8.3)

## 2020-01-08 LAB — LIPID PANEL
Cholesterol: 244 mg/dL — ABNORMAL HIGH (ref 0–200)
HDL: 86.5 mg/dL (ref >39.00)
LDL Cholesterol: 148 mg/dL — ABNORMAL HIGH (ref 0–99)
NonHDL: 157.98
Total CHOL/HDL Ratio: 3
Triglycerides: 52 mg/dL (ref 0.0–149.0)
VLDL: 10.4 mg/dL (ref 0.0–40.0)

## 2020-01-08 LAB — VITAMIN B12: Vitamin B-12: 407 pg/mL (ref 211–911)

## 2020-01-12 ENCOUNTER — Other Ambulatory Visit: Payer: Self-pay | Admitting: Family Medicine

## 2020-01-12 NOTE — Telephone Encounter (Signed)
Pease advise on refill. Thank you.  

## 2020-01-12 NOTE — Telephone Encounter (Signed)
Sent. Thanks.   

## 2020-01-15 ENCOUNTER — Ambulatory Visit (INDEPENDENT_AMBULATORY_CARE_PROVIDER_SITE_OTHER): Payer: 59 | Admitting: Family Medicine

## 2020-01-15 ENCOUNTER — Encounter: Payer: Self-pay | Admitting: Family Medicine

## 2020-01-15 ENCOUNTER — Other Ambulatory Visit: Payer: Self-pay

## 2020-01-15 VITALS — BP 140/80 | HR 73 | Temp 96.3°F | Ht 66.0 in | Wt 183.9 lb

## 2020-01-15 DIAGNOSIS — M158 Other polyosteoarthritis: Secondary | ICD-10-CM

## 2020-01-15 DIAGNOSIS — Z7189 Other specified counseling: Secondary | ICD-10-CM

## 2020-01-15 DIAGNOSIS — E538 Deficiency of other specified B group vitamins: Secondary | ICD-10-CM

## 2020-01-15 DIAGNOSIS — Z0000 Encounter for general adult medical examination without abnormal findings: Secondary | ICD-10-CM

## 2020-01-15 DIAGNOSIS — K219 Gastro-esophageal reflux disease without esophagitis: Secondary | ICD-10-CM

## 2020-01-15 DIAGNOSIS — R059 Cough, unspecified: Secondary | ICD-10-CM

## 2020-01-15 DIAGNOSIS — F411 Generalized anxiety disorder: Secondary | ICD-10-CM

## 2020-01-15 MED ORDER — CYANOCOBALAMIN 1000 MCG/ML IJ SOLN: INTRAMUSCULAR | 5 refills | Status: DC

## 2020-01-15 MED ORDER — HYDROCHLOROTHIAZIDE 25 MG PO TABS: ORAL_TABLET | ORAL | 3 refills | Status: DC

## 2020-01-15 MED ORDER — CELECOXIB 200 MG PO CAPS: 200.0000 mg | ORAL_CAPSULE | Freq: Every day | ORAL | 3 refills | Status: DC

## 2020-01-15 MED ORDER — DULOXETINE HCL 20 MG PO CPEP: ORAL_CAPSULE | ORAL | 3 refills | Status: DC

## 2020-01-15 MED ORDER — PANTOPRAZOLE SODIUM 40 MG PO TBEC: DELAYED_RELEASE_TABLET | ORAL | 3 refills | Status: DC

## 2020-01-15 NOTE — Patient Instructions (Addendum)
Check on follow up with GI/Dr. Loreta Ave.  Check with your insurance to see if they will cover the shingles and tetanus shots.  Take care.  Glad to see you. Please check with ortho about your hip pain.  Let me know if you BP is persistently about 140/90.

## 2020-01-15 NOTE — Progress Notes (Signed)
This visit occurred during the SARS-CoV-2 public health emergency.  Safety protocols were in place, including screening questions prior to the visit, additional usage of staff PPE, and extensive cleaning of exam room while observing appropriate contact time as indicated for disinfecting solutions.  CPE- See plan.  Routine anticipatory guidance given to patient.  See health maintenance.  The possibility exists that previously documented standard health maintenance information may have been brought forward from a previous encounter into this note.  If needed, that same information has been updated to reflect the current situation based on today's encounter.    Tetanus 2011, to be done at pharmacy as of 2022 Flu2021 PNA not due shingles d/w pt.  covid vaccine 2021 Colonoscopy 2011.  She is going to check on f/u with Dr. Loreta Ave.   Pap not due, d/w pt. s/p hysterectomy.  DXA not due  Mammogram 2022, patient already aware of negative result. Living will d/w pt. Husband designated if patient were incapacitated.  Diet and exercise d/w pt. HCV screening done prev.  She is going to work on diet re: lipids.  D/w pt.    She was in [place] in 12/2019.  Had cough, GI upset, aches.  She presumed she could have had covid at the time but she wasn't tested at the time.  Cough is slowly getting better.    HCTZ mainly used to control nocturia, less for BP.  No ADE on med.  She can check BP at home and update me as needed.  Nocturia x1 is manageable.    B12 def.  Still on replacement.  B12 wnl.  No numbness or tingling.  Labs d/w pt.  trough level, 407.    Anxiety.  Still on cymbalta and prn xanax.  Sleeping better with med use.  No ADE on med.  Taking BZD prn qhs, 1/2 tab at night, it helps. Her sx tend to be worse at night, waking up worrying at night w/o med use.  No SI/HI.    Joint pain.  Taking tramadol prn.  taking celebrex at baseline.  No ADE on med.  She is still having diffuse joint aches,  esp B hands and L hip pain.  She is putting up with discomfort at baseline and she deferred ortho eval at this point.  she had a day w/o celebrex and didn't feel as well as when on med.    GERD/band hx noted.  Still on PPI daily.  Sx controlled now.   PMH and SH reviewed  Meds, vitals, and allergies reviewed.   ROS: Per HPI.  Unless specifically indicated otherwise in HPI, the patient denies:  General: fever. Eyes: acute vision changes ENT: sore throat Cardiovascular: chest pain Respiratory: SOB GI: vomiting GU: dysuria Musculoskeletal: acute back pain Derm: acute rash Neuro: acute motor dysfunction Psych: worsening mood Endocrine: polydipsia Heme: bleeding Allergy: hayfever  GEN: nad, alert and oriented HEENT: ncat NECK: supple w/o LA CV: rrr. PULM: ctab, no inc wob ABD: soft, +bs EXT: no edema SKIN: no acute rash

## 2020-01-19 NOTE — Assessment & Plan Note (Signed)
Taking tramadol prn.  taking celebrex at baseline.  No ADE on med.  She is still having diffuse joint aches, esp B hands and L hip pain.  She is putting up with discomfort at baseline and she deferred ortho eval at this point.  she had a day w/o celebrex and didn't feel as well as when on med.   Would continue tramadol and Celebrex.

## 2020-01-19 NOTE — Assessment & Plan Note (Signed)
She was in [place] in 12/2019.  Had cough, GI upset, aches.  She presumed she could have had covid at the time but she wasn't tested at the time.  Cough is slowly getting better.

## 2020-01-19 NOTE — Assessment & Plan Note (Signed)
Still on cymbalta and prn xanax.  Sleeping better with med use.  No ADE on med.  Taking BZD prn qhs, 1/2 tab at night, it helps. Her sx tend to be worse at night, waking up worrying at night w/o med use.  No SI/HI.    Would continue both Cymbalta and Xanax.

## 2020-01-19 NOTE — Assessment & Plan Note (Signed)
Tetanus 2011, to be done at pharmacy as of 2022 Flu2021 PNA not due shingles d/w pt.  covid vaccine 2021 Colonoscopy 2011.  She is going to check on f/u with Dr. Loreta Ave.   Pap not due, d/w pt. s/p hysterectomy.  DXA not due  Mammogram 2022, patient already aware of negative result. Living will d/w pt. Husband designated if patient were incapacitated.  Diet and exercise d/w pt. HCV screening done prev.  She is going to work on diet re: lipids.  D/w pt.

## 2020-01-19 NOTE — Assessment & Plan Note (Signed)
B12 def.  Still on replacement.  B12 wnl.  No numbness or tingling.  Labs d/w pt.  trough level, 407.  Continue replacement as is.

## 2020-01-19 NOTE — Assessment & Plan Note (Signed)
GERD/band hx noted.  Still on PPI daily.  Sx controlled now.  Would continue pantoprazole as is.  She agrees.

## 2020-01-19 NOTE — Assessment & Plan Note (Signed)
Living will d/w pt.  Husband designated if patient were incapacitated.  

## 2020-03-22 ENCOUNTER — Other Ambulatory Visit: Payer: Self-pay | Admitting: Family Medicine

## 2020-03-22 NOTE — Telephone Encounter (Signed)
Refill request for Alprazolam 1 mg tablet  LOV - 01/15/2020 Next OV - 01/18/2021 Last refill - 12/10/19 #60/1

## 2020-03-23 NOTE — Telephone Encounter (Signed)
Sent. Thanks.   

## 2020-04-20 ENCOUNTER — Other Ambulatory Visit: Payer: Self-pay | Admitting: Surgery

## 2020-04-20 DIAGNOSIS — Z9884 Bariatric surgery status: Secondary | ICD-10-CM

## 2020-06-26 ENCOUNTER — Other Ambulatory Visit: Payer: Self-pay | Admitting: Family Medicine

## 2020-06-28 NOTE — Telephone Encounter (Signed)
Refill request for traMADol (ULTRAM) 50 MG tablet  LOV - 01/15/2020 Next OV - 01/18/2021 Last refill -  01/12/20 #30/1

## 2020-06-29 NOTE — Telephone Encounter (Signed)
Sent. Thanks.   

## 2020-09-07 ENCOUNTER — Telehealth: Payer: Self-pay | Admitting: Family Medicine

## 2020-09-07 MED ORDER — BENZONATATE 200 MG PO CAPS: 200.0000 mg | ORAL_CAPSULE | Freq: Three times a day (TID) | ORAL | 1 refills | Status: DC | PRN

## 2020-09-07 NOTE — Telephone Encounter (Signed)
Spoke with patient and notified her rx was sent. Advised patient if she does not get better or gets worse to let us know.

## 2020-09-07 NOTE — Telephone Encounter (Signed)
Pt tested positive over the weekend for covid pt didn't want to schedule appt only wanted medication called in

## 2020-09-07 NOTE — Telephone Encounter (Signed)
Patients started having sx Friday night/Saturday morning and took home covid test and was positive. Patient has sore throat, headaches, ear pain, congestion, cough that is wet but nothing really coming up yet and did have low grade fever but seems to be better today. Patients temp today was 98.7. She has taken some tesslon perles her husband had left over the past two days.

## 2020-09-07 NOTE — Telephone Encounter (Signed)
Would continue with tessalon (rx sent), rest and fluids.  If already feeling some better then she likely isn't going to need/benefit from antivirals (especially since she is lower risk having been vaccinated).  If worse in the meantime, then please let us know.  I hope she feels better/back to baseline soon.  Thanks.

## 2020-09-07 NOTE — Addendum Note (Signed)
Addended by: Joaquim Nam on: 09/07/2020 01:54 PM   Modules accepted: Orders

## 2020-09-22 ENCOUNTER — Other Ambulatory Visit: Payer: Self-pay | Admitting: Family Medicine

## 2020-09-22 NOTE — Telephone Encounter (Signed)
Last filled 06-26-20 #60 Last OV 01-15-20 Next OV 01-18-21 [facility]

## 2020-09-22 NOTE — Telephone Encounter (Signed)
Sent. Thanks.   

## 2020-11-05 ENCOUNTER — Encounter: Payer: Self-pay | Admitting: Family Medicine

## 2020-11-05 ENCOUNTER — Other Ambulatory Visit: Payer: Self-pay

## 2020-11-05 ENCOUNTER — Ambulatory Visit (INDEPENDENT_AMBULATORY_CARE_PROVIDER_SITE_OTHER): Payer: 59 | Admitting: Family Medicine

## 2020-11-05 VITALS — BP 150/90 | HR 65 | Temp 97.7°F | Ht 66.0 in | Wt 187.0 lb

## 2020-11-05 DIAGNOSIS — I10 Essential (primary) hypertension: Secondary | ICD-10-CM | POA: Diagnosis not present

## 2020-11-05 DIAGNOSIS — R0600 Dyspnea, unspecified: Secondary | ICD-10-CM

## 2020-11-05 DIAGNOSIS — E538 Deficiency of other specified B group vitamins: Secondary | ICD-10-CM

## 2020-11-05 LAB — CBC WITH DIFFERENTIAL/PLATELET
Basophils Absolute: 0.1 10*3/uL (ref 0.0–0.1)
Basophils Relative: 1.8 % (ref 0.0–3.0)
Eosinophils Absolute: 0.2 10*3/uL (ref 0.0–0.7)
Eosinophils Relative: 3.5 % (ref 0.0–5.0)
HCT: 46 % (ref 36.0–46.0)
Hemoglobin: 15.3 g/dL — ABNORMAL HIGH (ref 12.0–15.0)
Lymphocytes Relative: 28.7 % (ref 12.0–46.0)
Lymphs Abs: 1.4 10*3/uL (ref 0.7–4.0)
MCHC: 33.1 g/dL (ref 30.0–36.0)
MCV: 97 fl (ref 78.0–100.0)
Monocytes Absolute: 0.4 10*3/uL (ref 0.1–1.0)
Monocytes Relative: 9.2 % (ref 3.0–12.0)
Neutro Abs: 2.7 10*3/uL (ref 1.4–7.7)
Neutrophils Relative %: 56.8 % (ref 43.0–77.0)
Platelets: 224 10*3/uL (ref 150.0–400.0)
RBC: 4.74 Mil/uL (ref 3.87–5.11)
RDW: 12.7 % (ref 11.5–15.5)
WBC: 4.8 10*3/uL (ref 4.0–10.5)

## 2020-11-05 LAB — LIPID PANEL
Cholesterol: 242 mg/dL — ABNORMAL HIGH (ref 0–200)
HDL: 87 mg/dL (ref >39.00)
LDL Cholesterol: 144 mg/dL — ABNORMAL HIGH (ref 0–99)
NonHDL: 155.39
Total CHOL/HDL Ratio: 3
Triglycerides: 57 mg/dL (ref 0.0–149.0)
VLDL: 11.4 mg/dL (ref 0.0–40.0)

## 2020-11-05 LAB — COMPREHENSIVE METABOLIC PANEL
ALT: 17 U/L (ref 0–35)
AST: 24 U/L (ref 0–37)
Albumin: 4.5 g/dL (ref 3.5–5.2)
Alkaline Phosphatase: 86 U/L (ref 39–117)
BUN: 17 mg/dL (ref 6–23)
CO2: 28 mEq/L (ref 19–32)
Calcium: 9.6 mg/dL (ref 8.4–10.5)
Chloride: 104 mEq/L (ref 96–112)
Creatinine, Ser: 0.72 mg/dL (ref 0.40–1.20)
GFR: 90.08 mL/min (ref >60.00)
Glucose, Bld: 89 mg/dL (ref 70–99)
Potassium: 3.8 mEq/L (ref 3.5–5.1)
Sodium: 142 mEq/L (ref 135–145)
Total Bilirubin: 1 mg/dL (ref 0.2–1.2)
Total Protein: 7 g/dL (ref 6.0–8.3)

## 2020-11-05 LAB — TSH: TSH: 1.42 u[IU]/mL (ref 0.35–5.50)

## 2020-11-05 LAB — VITAMIN B12: Vitamin B-12: 504 pg/mL (ref 211–911)

## 2020-11-05 LAB — BRAIN NATRIURETIC PEPTIDE: Pro B Natriuretic peptide (BNP): 40 pg/mL (ref 0.0–100.0)

## 2020-11-05 MED ORDER — AMLODIPINE BESYLATE 5 MG PO TABS: 2.5000 mg | ORAL_TABLET | Freq: Every day | ORAL | 3 refills | Status: DC

## 2020-11-05 NOTE — Progress Notes (Signed)
This visit occurred during the SARS-CoV-2 public health emergency.  Safety protocols were in place, including screening questions prior to the visit, additional usage of staff PPE, and extensive cleaning of exam room while observing appropriate contact time as indicated for disinfecting solutions.  She wasn't feeling well in general for a few months.  HA noted.  Checked her BP recently, after sx had been going on for a while.  BP elevated at home.   Recheck BP 150/90.  Fatigued.  Still on HCTZ at baseline.  No heart racing.  Not CP.  Stressors noted caring for her mother.  Some occ dyspnea and B finger tingling.  Walking tired, less motivated to get up and get moving in the AM.  SBP checks at home- 180s or higher.     Meds, vitals, and allergies reviewed.  PMH and SH reviewed  ROS: Per HPI unless specifically indicated in ROS section   GEN: nad, alert and oriented HEENT: ncat NECK: supple w/o LA CV: rrr. PULM: ctab, no inc wob ABD: soft, +bs EXT: no edema SKIN: well perfused.

## 2020-11-05 NOTE — Patient Instructions (Addendum)
Go to the lab on the way out.   If you have mychart we'll likely use that to update you.    Take care.  Glad to see you. Add on amlodipine in the meantime.  Update me about your BP in about 1 week.  Start with 2.5mg  a day.

## 2020-11-07 NOTE — Assessment & Plan Note (Signed)
Discussed options.  Check basic labs.  Continue previous baseline medications but add on amlodipine in the meantime.  Update me about your BP in about 1 week.  Start with 2.5mg  a day.  Can increase to 5 mg a day of amlodipine if needed.

## 2020-11-16 ENCOUNTER — Encounter: Payer: Self-pay | Admitting: Family Medicine

## 2020-11-17 ENCOUNTER — Other Ambulatory Visit: Payer: Self-pay | Admitting: Family Medicine

## 2020-11-17 MED ORDER — AMLODIPINE BESYLATE 5 MG PO TABS: 5.0000 mg | ORAL_TABLET | Freq: Every day | ORAL | Status: DC

## 2020-12-21 ENCOUNTER — Other Ambulatory Visit: Payer: Self-pay | Admitting: Family Medicine

## 2021-01-11 ENCOUNTER — Other Ambulatory Visit: Payer: 59

## 2021-01-14 ENCOUNTER — Other Ambulatory Visit: Payer: Self-pay

## 2021-01-18 ENCOUNTER — Ambulatory Visit (INDEPENDENT_AMBULATORY_CARE_PROVIDER_SITE_OTHER): Payer: 59 | Admitting: Family Medicine

## 2021-01-18 ENCOUNTER — Other Ambulatory Visit: Payer: Self-pay

## 2021-01-18 ENCOUNTER — Encounter: Payer: Self-pay | Admitting: Family Medicine

## 2021-01-18 VITALS — BP 130/78 | HR 63 | Temp 98.1°F | Ht 66.0 in | Wt 186.0 lb

## 2021-01-18 DIAGNOSIS — Z7189 Other specified counseling: Secondary | ICD-10-CM

## 2021-01-18 DIAGNOSIS — Z0000 Encounter for general adult medical examination without abnormal findings: Secondary | ICD-10-CM | POA: Diagnosis not present

## 2021-01-18 DIAGNOSIS — R002 Palpitations: Secondary | ICD-10-CM | POA: Diagnosis not present

## 2021-01-18 DIAGNOSIS — I10 Essential (primary) hypertension: Secondary | ICD-10-CM

## 2021-01-18 DIAGNOSIS — E538 Deficiency of other specified B group vitamins: Secondary | ICD-10-CM

## 2021-01-18 DIAGNOSIS — M158 Other polyosteoarthritis: Secondary | ICD-10-CM

## 2021-01-18 DIAGNOSIS — Z9884 Bariatric surgery status: Secondary | ICD-10-CM

## 2021-01-18 DIAGNOSIS — F419 Anxiety disorder, unspecified: Secondary | ICD-10-CM

## 2021-01-18 MED ORDER — CYANOCOBALAMIN 1000 MCG/ML IJ SOLN: INTRAMUSCULAR | 5 refills | Status: DC

## 2021-01-18 MED ORDER — AMLODIPINE BESYLATE 5 MG PO TABS: 5.0000 mg | ORAL_TABLET | Freq: Every day | ORAL | 3 refills | Status: DC

## 2021-01-18 MED ORDER — ALPRAZOLAM 1 MG PO TABS: 1.0000 mg | ORAL_TABLET | Freq: Two times a day (BID) | ORAL | 1 refills | Status: DC | PRN

## 2021-01-18 MED ORDER — CELECOXIB 200 MG PO CAPS: 200.0000 mg | ORAL_CAPSULE | Freq: Every day | ORAL | 3 refills | Status: DC

## 2021-01-18 MED ORDER — TRAMADOL HCL 50 MG PO TABS: 50.0000 mg | ORAL_TABLET | Freq: Every day | ORAL | 3 refills | Status: DC | PRN

## 2021-01-18 NOTE — Patient Instructions (Addendum)
Please call Dr. Loreta Ave and Dr. Daphine Deutscher about follow up.   You can call for a mammogram at [facility] at [facility].  [address] [place] [phone]  Take care.  Glad to see you.

## 2021-01-18 NOTE — Progress Notes (Signed)
This visit occurred during the SARS-CoV-2 public health emergency.  Safety protocols were in place, including screening questions prior to the visit, additional usage of staff PPE, and extensive cleaning of exam room while observing appropriate contact time as indicated for disinfecting solutions.  CPE- See plan.  Routine anticipatory guidance given to patient.  See health maintenance.  The possibility exists that previously documented standard health maintenance information may have been brought forward from a previous encounter into this note.  If needed, that same information has been updated to reflect the current situation based on today's encounter.    Tetanus 2023 Flu 2022 PNA not due shingles d/w pt.  covid vaccine prev done.   Colonoscopy 2011.  She is going to check on f/u with Dr. Loreta Ave.   Pap not due, d/w pt.  s/p hysterectomy.   DXA not due  Mammogram 2022, patient already aware of negative result. Living will d/w pt.  Husband designated if patient were incapacitated.   Diet and exercise d/w pt.  HCV screening done prev.    B12 def.  Still on replacement.  B12 wnl.  No numbness or tingling.  Labs d/w pt.   Anxiety.  Still on cymbalta and prn xanax.  Sleeping better with med use.  No ADE on med.  Taking BZD prn qhs, 1/2 tab at night, it helps. Her sx tend to be worse at night, waking up worrying at night w/o med use.  No SI/HI.     Joint pain.  Taking tramadol prn.  taking celebrex at baseline.  No ADE on med.  She is still having diffuse joint aches, esp B hands and L hip pain.  She is putting up with discomfort at baseline and she deferred ortho eval at this point. she'll update me as needed.     GERD/band hx noted.  Still on PPI daily.  Sx controlled now.   Hypertension:   She had elevated BP readings at home.  Still on amlodipine HCTZ.   Using medication without problems or lightheadedness: yes Chest pain with exertion:no Edema:no Short of breath: no Average home BPs:  140s/80s usually at home.   Prev labs d/w pt.   D/w pt about diet and exercise for HLD.     She hasn't noted documented inc in pulse that persists but she can felt an occ flutter in the chest.  Can last a few minutes w/o lightheadedness.  Noted in the last few weeks.    PMH and SH reviewed  Meds, vitals, and allergies reviewed.   ROS: Per HPI.  Unless specifically indicated otherwise in HPI, the patient denies:  General: fever. Eyes: acute vision changes ENT: sore throat Cardiovascular: chest pain Respiratory: SOB GI: vomiting GU: dysuria Musculoskeletal: acute back pain Derm: acute rash Neuro: acute motor dysfunction Psych: worsening mood Endocrine: polydipsia Heme: bleeding Allergy: hayfever  GEN: nad, alert and oriented HEENT: ncat NECK: supple w/o LA CV: rrr. PULM: ctab, no inc wob ABD: soft, +bs EXT: no edema SKIN: no acute rash  The 10-year ASCVD risk score (Arnett DK, et al., 2019) is: 8.9%   Values used to calculate the score:     Age: 61 years     Sex: Female     Is Non-Hispanic African American: No     Diabetic: No     Tobacco smoker: Yes     Systolic Blood Pressure: 130 mmHg     Is BP treated: Yes     HDL Cholesterol: 87 mg/dL  Total Cholesterol: 242 mg/dL'

## 2021-01-19 DIAGNOSIS — R002 Palpitations: Secondary | ICD-10-CM | POA: Insufficient documentation

## 2021-01-19 DIAGNOSIS — F419 Anxiety disorder, unspecified: Secondary | ICD-10-CM | POA: Insufficient documentation

## 2021-01-19 NOTE — Assessment & Plan Note (Signed)
Tetanus 2023 Flu 2022 PNA not due shingles d/w pt.  covid vaccine prev done.   Colonoscopy 2011.  She is going to check on f/u with Dr. Loreta Ave.   Pap not due, d/w pt.  s/p hysterectomy.   DXA not due  Mammogram 2022, patient already aware of negative result. Living will d/w pt.  Husband designated if patient were incapacitated.   Diet and exercise d/w pt.  HCV screening done prev.

## 2021-01-19 NOTE — Assessment & Plan Note (Signed)
Diet discussed with patient.  Would continue PPI.

## 2021-01-19 NOTE — Assessment & Plan Note (Signed)
continue cymbalta and prn xanax.  Sleeping better with med use.  No ADE on med.  Taking BZD prn qhs, 1/2 tab at night, it helps. Her sx tend to be worse at night, waking up worrying at night w/o med use.  No SI/HI.

## 2021-01-19 NOTE — Assessment & Plan Note (Signed)
Living will d/w pt.  Husband designated if patient were incapacitated.  

## 2021-01-19 NOTE — Assessment & Plan Note (Signed)
Continue B12 replacement. ?

## 2021-01-19 NOTE — Assessment & Plan Note (Signed)
Occasional brief symptoms noted by patient.  EKG unremarkable.  Previous labs discussed with patient.  If she has persistent or more symptoms then I want her to let me know and we can refer to cardiology.  She agrees with plan.

## 2021-01-19 NOTE — Assessment & Plan Note (Signed)
Continue as needed tramadol and Celebrex.  She will update me as needed.

## 2021-01-19 NOTE — Assessment & Plan Note (Signed)
Continue amlodipine and hydrochlorothiazide.  Previous labs discussed with patient.  Discussed with patient about diet and exercise for hyperlipidemia.

## 2021-01-27 ENCOUNTER — Other Ambulatory Visit: Payer: Self-pay | Admitting: Family Medicine

## 2021-01-27 DIAGNOSIS — Z1231 Encounter for screening mammogram for malignant neoplasm of breast: Secondary | ICD-10-CM

## 2021-02-18 ENCOUNTER — Telehealth: Payer: Self-pay | Admitting: Physical Medicine and Rehabilitation

## 2021-02-18 NOTE — Telephone Encounter (Signed)
Pt called. States that she wanted to see newton about a new issue with her shoulder. Wondering if its okay to sch OV?

## 2021-02-21 NOTE — Telephone Encounter (Signed)
Called and advised pt.

## 2021-03-02 ENCOUNTER — Ambulatory Visit: Payer: 59 | Admitting: Orthopaedic Surgery

## 2021-03-03 ENCOUNTER — Ambulatory Visit
Admission: RE | Admit: 2021-03-03 | Discharge: 2021-03-03 | Disposition: A | Discharge status: Home / Self Care | Payer: 59 | Source: Ambulatory Visit | Attending: Family Medicine | Admitting: Family Medicine

## 2021-03-03 ENCOUNTER — Other Ambulatory Visit: Payer: Self-pay

## 2021-03-03 DIAGNOSIS — Z1231 Encounter for screening mammogram for malignant neoplasm of breast: Secondary | ICD-10-CM | POA: Insufficient documentation

## 2021-03-21 ENCOUNTER — Inpatient Hospital Stay
Admission: RE | Admit: 2021-03-21 | Discharge: 2021-03-21 | Disposition: A | Discharge status: Home / Self Care | Payer: Self-pay | Source: Ambulatory Visit | Attending: *Deleted | Admitting: *Deleted

## 2021-03-21 ENCOUNTER — Other Ambulatory Visit: Payer: Self-pay | Admitting: *Deleted

## 2021-03-21 DIAGNOSIS — Z1231 Encounter for screening mammogram for malignant neoplasm of breast: Secondary | ICD-10-CM

## 2021-05-04 LAB — HM COLONOSCOPY

## 2021-07-16 ENCOUNTER — Other Ambulatory Visit: Payer: Self-pay | Admitting: Family Medicine

## 2021-07-18 NOTE — Telephone Encounter (Signed)
Refill request for ALPRAZOLAM 1 MG TABLET  LOV - 01/18/21 Next OV - 01/20/21 Last refill - 01/18/21 #60/1

## 2021-07-19 NOTE — Telephone Encounter (Signed)
Sent. Thanks.   

## 2021-07-29 ENCOUNTER — Other Ambulatory Visit: Payer: Self-pay | Admitting: Family Medicine

## 2021-07-29 NOTE — Telephone Encounter (Signed)
Refill request for TRAMADOL HCL 50 MG TABLET  LOV - 01/18/21 Next OV - 01/20/22 Last refill - 01/18/21 #30/3

## 2021-10-22 ENCOUNTER — Other Ambulatory Visit: Payer: Self-pay | Admitting: Family Medicine

## 2021-11-16 ENCOUNTER — Other Ambulatory Visit: Payer: Self-pay | Admitting: Family Medicine

## 2021-12-11 ENCOUNTER — Other Ambulatory Visit: Payer: Self-pay | Admitting: Family Medicine

## 2022-01-01 ENCOUNTER — Other Ambulatory Visit: Payer: Self-pay | Admitting: Family Medicine

## 2022-01-01 DIAGNOSIS — E538 Deficiency of other specified B group vitamins: Secondary | ICD-10-CM

## 2022-01-01 DIAGNOSIS — I10 Essential (primary) hypertension: Secondary | ICD-10-CM

## 2022-01-13 ENCOUNTER — Other Ambulatory Visit (INDEPENDENT_AMBULATORY_CARE_PROVIDER_SITE_OTHER): Payer: 59

## 2022-01-13 ENCOUNTER — Other Ambulatory Visit: Payer: 59

## 2022-01-13 DIAGNOSIS — E538 Deficiency of other specified B group vitamins: Secondary | ICD-10-CM

## 2022-01-13 DIAGNOSIS — I10 Essential (primary) hypertension: Secondary | ICD-10-CM | POA: Diagnosis not present

## 2022-01-13 LAB — COMPREHENSIVE METABOLIC PANEL
ALT: 21 U/L (ref 0–35)
AST: 26 U/L (ref 0–37)
Albumin: 4.3 g/dL (ref 3.5–5.2)
Alkaline Phosphatase: 93 U/L (ref 39–117)
BUN: 13 mg/dL (ref 6–23)
CO2: 30 mEq/L (ref 19–32)
Calcium: 9.3 mg/dL (ref 8.4–10.5)
Chloride: 105 mEq/L (ref 96–112)
Creatinine, Ser: 0.69 mg/dL (ref 0.40–1.20)
GFR: 92.73 mL/min (ref >60.00)
Glucose, Bld: 85 mg/dL (ref 70–99)
Potassium: 4.3 mEq/L (ref 3.5–5.1)
Sodium: 143 mEq/L (ref 135–145)
Total Bilirubin: 0.6 mg/dL (ref 0.2–1.2)
Total Protein: 6.7 g/dL (ref 6.0–8.3)

## 2022-01-13 LAB — CBC WITH DIFFERENTIAL/PLATELET
Basophils Absolute: 0.1 10*3/uL (ref 0.0–0.1)
Basophils Relative: 1.8 % (ref 0.0–3.0)
Eosinophils Absolute: 0.2 10*3/uL (ref 0.0–0.7)
Eosinophils Relative: 4.2 % (ref 0.0–5.0)
HCT: 47.9 % — ABNORMAL HIGH (ref 36.0–46.0)
Hemoglobin: 16 g/dL — ABNORMAL HIGH (ref 12.0–15.0)
Lymphocytes Relative: 29 % (ref 12.0–46.0)
Lymphs Abs: 1.5 10*3/uL (ref 0.7–4.0)
MCHC: 33.4 g/dL (ref 30.0–36.0)
MCV: 97.3 fl (ref 78.0–100.0)
Monocytes Absolute: 0.4 10*3/uL (ref 0.1–1.0)
Monocytes Relative: 7.9 % (ref 3.0–12.0)
Neutro Abs: 3 10*3/uL (ref 1.4–7.7)
Neutrophils Relative %: 57.1 % (ref 43.0–77.0)
Platelets: 257 10*3/uL (ref 150.0–400.0)
RBC: 4.93 Mil/uL (ref 3.87–5.11)
RDW: 12.8 % (ref 11.5–15.5)
WBC: 5.2 10*3/uL (ref 4.0–10.5)

## 2022-01-13 LAB — LIPID PANEL
Cholesterol: 243 mg/dL — ABNORMAL HIGH (ref 0–200)
HDL: 81.4 mg/dL (ref >39.00)
LDL Cholesterol: 146 mg/dL — ABNORMAL HIGH (ref 0–99)
NonHDL: 161.67
Total CHOL/HDL Ratio: 3
Triglycerides: 76 mg/dL (ref 0.0–149.0)
VLDL: 15.2 mg/dL (ref 0.0–40.0)

## 2022-01-13 LAB — VITAMIN B12: Vitamin B-12: 468 pg/mL (ref 211–911)

## 2022-01-15 ENCOUNTER — Other Ambulatory Visit: Payer: Self-pay | Admitting: Family Medicine

## 2022-01-20 ENCOUNTER — Encounter: Payer: Self-pay | Admitting: Family Medicine

## 2022-01-20 ENCOUNTER — Ambulatory Visit (INDEPENDENT_AMBULATORY_CARE_PROVIDER_SITE_OTHER): Payer: 59 | Admitting: Family Medicine

## 2022-01-20 VITALS — BP 118/82 | HR 64 | Temp 97.8°F | Ht 66.0 in | Wt 185.0 lb

## 2022-01-20 DIAGNOSIS — Z0000 Encounter for general adult medical examination without abnormal findings: Secondary | ICD-10-CM | POA: Diagnosis not present

## 2022-01-20 DIAGNOSIS — Z7189 Other specified counseling: Secondary | ICD-10-CM

## 2022-01-20 DIAGNOSIS — F419 Anxiety disorder, unspecified: Secondary | ICD-10-CM

## 2022-01-20 DIAGNOSIS — I10 Essential (primary) hypertension: Secondary | ICD-10-CM

## 2022-01-20 DIAGNOSIS — E538 Deficiency of other specified B group vitamins: Secondary | ICD-10-CM

## 2022-01-20 DIAGNOSIS — M158 Other polyosteoarthritis: Secondary | ICD-10-CM

## 2022-01-20 DIAGNOSIS — Z9884 Bariatric surgery status: Secondary | ICD-10-CM

## 2022-01-20 MED ORDER — CELECOXIB 200 MG PO CAPS: 200.0000 mg | ORAL_CAPSULE | Freq: Every day | ORAL | 3 refills | Status: DC

## 2022-01-20 MED ORDER — TRAMADOL HCL 50 MG PO TABS: 50.0000 mg | ORAL_TABLET | Freq: Every day | ORAL | 3 refills | Status: DC | PRN

## 2022-01-20 MED ORDER — PANTOPRAZOLE SODIUM 40 MG PO TBEC: DELAYED_RELEASE_TABLET | ORAL | 3 refills | Status: DC

## 2022-01-20 MED ORDER — DULOXETINE HCL 20 MG PO CPEP: 20.0000 mg | ORAL_CAPSULE | Freq: Every day | ORAL | 3 refills | Status: DC

## 2022-01-20 MED ORDER — CYANOCOBALAMIN 1000 MCG/ML IJ SOLN: INTRAMUSCULAR | 5 refills | Status: DC

## 2022-01-20 NOTE — Patient Instructions (Addendum)
Don't change your meds.  Let me know if you have trouble getting set up for a mammogram or follow up re: your band.   Take care.  Glad to see you.

## 2022-01-20 NOTE — Assessment & Plan Note (Signed)
Living will d/w pt.  Husband and daughter Darrick Grinder designated if patient were incapacitated.

## 2022-01-20 NOTE — Progress Notes (Signed)
CPE- See plan.  Routine anticipatory guidance given to patient.  See health maintenance.  The possibility exists that previously documented standard health maintenance information may have been brought forward from a previous encounter into this note.  If needed, that same information has been updated to reflect the current situation based on today's encounter.    Tetanus 2023 Flu 2023 PNA not due shingles d/w pt.  covid vaccine prev done.   Colonoscopy 2023 Pap not due, d/w pt.  s/p hysterectomy.   DXA not due  Mammogram due, d/w pt.   Living will d/w pt.  Husband and daughter Darrick Grinder designated if patient were incapacitated.   Diet and exercise d/w pt.  HCV screening done prev.    Hypertension:    Using medication without problems or lightheadedness: yes Chest pain with exertion:no Edema:no Short of breath:no  B12 def.  On replacement.  Compliant.   Labs discussed with patient.  Taking tramadol prn for joint pain, with celebrex.    Mood with patient.  She is able to manage as is.  Her daughter/grandchild moved in with her family.  Her mother has chronic medical concerns.  Still on cymbalta at baseline with prn xanax, rarely used.  No suicidal or homicidal intent.  PMH and SH reviewed  Meds, vitals, and allergies reviewed.   ROS: Per HPI.  Unless specifically indicated otherwise in HPI, the patient denies:  General: fever. Eyes: acute vision changes ENT: sore throat Cardiovascular: chest pain Respiratory: SOB GI: vomiting GU: dysuria Musculoskeletal: acute back pain Derm: acute rash Neuro: acute motor dysfunction Psych: worsening mood Endocrine: polydipsia Heme: bleeding Allergy: hayfever  GEN: nad, alert and oriented HEENT:  ncat NECK: supple w/o LA CV: rrr. PULM: ctab, no inc wob ABD: soft, +bs EXT: no edema SKIN: no acute rash Speech and judgment intact.  No tremor.

## 2022-01-21 NOTE — Assessment & Plan Note (Signed)
Would continue tramadol along with Celebrex.

## 2022-01-21 NOTE — Assessment & Plan Note (Signed)
Continue replacement as is. 

## 2022-01-21 NOTE — Assessment & Plan Note (Signed)
Discussed diet and exercise.  Continue amlodipine and hydrochlorothiazide.  Labs discussed with patient.

## 2022-01-21 NOTE — Assessment & Plan Note (Signed)
I asked her to check with the lab and clinic and update me as needed.

## 2022-01-21 NOTE — Assessment & Plan Note (Signed)
Would continue Cymbalta.  Xanax and update me as needed.  Okay for outpatient follow-up.

## 2022-01-21 NOTE — Assessment & Plan Note (Signed)
Tetanus 2023 Flu 2023 PNA not due shingles d/w pt.  covid vaccine prev done.   Colonoscopy 2023 Pap not due, d/w pt.  s/p hysterectomy.   DXA not due  Mammogram due, d/w pt.   Living will d/w pt.  Husband and daughter Darrick Grinder designated if patient were incapacitated.   Diet and exercise d/w pt.  HCV screening done prev.

## 2022-01-30 DIAGNOSIS — Z1231 Encounter for screening mammogram for malignant neoplasm of breast: Secondary | ICD-10-CM | POA: Diagnosis not present

## 2022-01-30 LAB — HM MAMMOGRAPHY

## 2022-05-03 ENCOUNTER — Other Ambulatory Visit: Payer: Self-pay | Admitting: Family Medicine

## 2022-05-04 ENCOUNTER — Telehealth: Payer: Self-pay

## 2022-05-04 ENCOUNTER — Other Ambulatory Visit (HOSPITAL_COMMUNITY): Payer: Self-pay

## 2022-05-04 ENCOUNTER — Other Ambulatory Visit: Payer: Self-pay | Admitting: Family Medicine

## 2022-05-04 NOTE — Telephone Encounter (Signed)
Pharmacy Patient Advocate Encounter  Prior Authorization for Pantoprazole has been submitted and approved by [employer] (ins).    PA # [id number] Effective dates: 05/04/2022 through 05/04/2023

## 2022-05-04 NOTE — Telephone Encounter (Signed)
Refill request for ALPRAZOLAM 1 MG TABLET   LOV - 01/20/22 Next OV - 01/23/23 Last refill - 07/19/21 #60/1

## 2022-05-04 NOTE — Telephone Encounter (Signed)
PA has been approved and documented in separate encounter, please sign off on encounter as PA team is unable to resolve RX requests. Thank you

## 2022-05-05 ENCOUNTER — Other Ambulatory Visit: Payer: Self-pay | Admitting: Family Medicine

## 2022-05-05 ENCOUNTER — Other Ambulatory Visit (HOSPITAL_COMMUNITY): Payer: Self-pay

## 2022-05-05 NOTE — Telephone Encounter (Signed)
Sent. Thanks.   

## 2022-05-05 NOTE — Telephone Encounter (Signed)
PA is still showing that it is not [facility]. I called them and pharmacist re- ran rx and still not approved. Not sure what needs to be fixed if [facility] to see what is wrong.

## 2022-05-09 ENCOUNTER — Other Ambulatory Visit (HOSPITAL_COMMUNITY): Payer: Self-pay

## 2022-05-09 NOTE — Telephone Encounter (Signed)
Sorry about that! Looks like I was looking at her approval for Lansoprazole. When attempting Pantoprazole PA, CMM hard stops and states that PA has already been resolved, but test claim still shows quantity limit. Has pt switched to lansoprazole?

## 2022-05-10 NOTE — Telephone Encounter (Signed)
Spoke with patient and she is taking pantoprazole; patient does not have a prescription for lansoprazole so not sure how that happened. Are we able to get a PA done for her pantoprazole?

## 2022-05-11 NOTE — Telephone Encounter (Signed)
We receive a number of keys/requests in CMM and not all of them are accurate, I should have double checked which medication it was originally put in for or someone might have been trying to be proactive on a medication that they knew they could get covered, unsure really. CMM hard stops me from submitting a PA for this member as "PA already resolved" so I will have to give the insurance a call, will keep you updated.

## 2022-05-15 ENCOUNTER — Other Ambulatory Visit (HOSPITAL_COMMUNITY): Payer: Self-pay

## 2022-05-15 ENCOUNTER — Telehealth: Payer: Self-pay

## 2022-05-15 NOTE — Telephone Encounter (Signed)
Pharmacy Patient Advocate Encounter   Received notification that prior authorization for Pantoprazole is required/requested.   PA submitted on 05/15/22 to (ins) [employer] via Telephone at [phone] [facility] or [facility]) confirmation # [id number] Status is pending  Per the representative, determination should be received within 24 to 48 hours.

## 2022-05-16 ENCOUNTER — Other Ambulatory Visit (HOSPITAL_COMMUNITY): Payer: Self-pay

## 2022-05-16 NOTE — Telephone Encounter (Signed)
Patient Advocate Encounter  Prior Authorization for Pantoprazole 40mg  has been approved with [employer].    PA# [employer] [id number] RN Effective dates: 05/15/22 through 05/15/23  Per [facility] test claim, copay for 30 days supply is $1.55  [facility]. Left a voicemail.   Approval letter attached to chart

## 2022-08-07 ENCOUNTER — Other Ambulatory Visit: Payer: Self-pay | Admitting: Family Medicine

## 2022-08-07 NOTE — Telephone Encounter (Signed)
Refill request for Tramadol 50 mg tabs and Alprazolam 1 mg tabs  LOV - 01/20/22 Next OV - 01/23/23 Last refill - Tramadol 01/20/22 #30/3                   Alprazolam 05/05/22 #60/1

## 2022-08-14 ENCOUNTER — Encounter: Payer: Self-pay | Admitting: Family Medicine

## 2022-08-17 ENCOUNTER — Telehealth: Payer: Self-pay

## 2022-08-17 ENCOUNTER — Other Ambulatory Visit (HOSPITAL_COMMUNITY): Payer: Self-pay

## 2022-08-17 NOTE — Telephone Encounter (Signed)
Pharmacy Patient Advocate Encounter  Received notification from [facility] that Prior Authorization for Tramadol 50MG  has been APPROVED from 08/14/22 to 02/10/23   PA #/Case ID/Reference #: [id number]

## 2022-10-02 ENCOUNTER — Other Ambulatory Visit: Payer: Self-pay | Admitting: Family Medicine

## 2022-10-31 ENCOUNTER — Telehealth: Payer: Self-pay | Admitting: Physical Medicine and Rehabilitation

## 2022-10-31 DIAGNOSIS — M545 Low back pain, unspecified: Secondary | ICD-10-CM | POA: Diagnosis not present

## 2022-10-31 DIAGNOSIS — M4316 Spondylolisthesis, lumbar region: Secondary | ICD-10-CM | POA: Diagnosis not present

## 2022-10-31 NOTE — Telephone Encounter (Signed)
Patient call needing to make an appointment. ZO#[phone]

## 2022-11-02 NOTE — Telephone Encounter (Signed)
Attempted to call patient, no answer 

## 2022-11-09 DIAGNOSIS — M25561 Pain in right knee: Secondary | ICD-10-CM | POA: Diagnosis not present

## 2022-11-21 DIAGNOSIS — M4316 Spondylolisthesis, lumbar region: Secondary | ICD-10-CM | POA: Diagnosis not present

## 2022-11-22 DIAGNOSIS — M25561 Pain in right knee: Secondary | ICD-10-CM | POA: Diagnosis not present

## 2022-11-27 DIAGNOSIS — M4316 Spondylolisthesis, lumbar region: Secondary | ICD-10-CM | POA: Diagnosis not present

## 2022-11-29 ENCOUNTER — Ambulatory Visit: Payer: 59 | Admitting: Family Medicine

## 2022-11-29 VITALS — BP 134/72 | HR 80 | Temp 98.4°F | Ht 66.0 in | Wt 183.1 lb

## 2022-11-29 DIAGNOSIS — R300 Dysuria: Secondary | ICD-10-CM | POA: Diagnosis not present

## 2022-11-29 DIAGNOSIS — N390 Urinary tract infection, site not specified: Secondary | ICD-10-CM | POA: Insufficient documentation

## 2022-11-29 DIAGNOSIS — F4321 Adjustment disorder with depressed mood: Secondary | ICD-10-CM | POA: Diagnosis not present

## 2022-11-29 DIAGNOSIS — R319 Hematuria, unspecified: Secondary | ICD-10-CM

## 2022-11-29 DIAGNOSIS — N3001 Acute cystitis with hematuria: Secondary | ICD-10-CM | POA: Diagnosis not present

## 2022-11-29 LAB — POC URINALSYSI DIPSTICK (AUTOMATED)
Glucose, UA: NEGATIVE
Nitrite, UA: POSITIVE
Protein, UA: POSITIVE — AB
Spec Grav, UA: >=1.03 — AB (ref 1.010–1.025)
Urobilinogen, UA: 4 U/dL — AB
pH, UA: 5 (ref 5.0–8.0)

## 2022-11-29 MED ORDER — CEFTRIAXONE SODIUM 1 G IJ SOLR
1.0000 g | Freq: Once | INTRAMUSCULAR | Status: AC
  Administered 2022-11-29: 1 g via INTRAMUSCULAR

## 2022-11-29 MED ORDER — CEPHALEXIN 500 MG PO CAPS: 500.0000 mg | ORAL_CAPSULE | Freq: Two times a day (BID) | ORAL | 0 refills | Status: DC

## 2022-11-29 NOTE — Assessment & Plan Note (Signed)
Discussed husband's recent passing. Support provided

## 2022-11-29 NOTE — Assessment & Plan Note (Addendum)
UA/micro and story concerning for UTI. Rx keflex 500mg  BID 7d course. Given associated nausea, will cover for upper urinary tract involvement with rocephin IM 1gm x1.  Update if not improving with treatment.  Pt agrees with plan.

## 2022-11-29 NOTE — Progress Notes (Addendum)
Ph: [phone] Fax: [phone]   Patient ID: Tiffany Campos, female    DOB: 1960/04/19, 62 y.o.   MRN: [id number]  This visit was conducted in person.  BP 134/72   Pulse 80   Temp 98.4 F (36.9 C) (Oral)   Ht 5\' 6"  (1.676 m)   Wt 183 lb 2 oz (83.1 kg)   SpO2 94%   BMI 29.56 kg/m    CC: UTI Subjective:   HPI: Tiffany Campos is a 62 y.o. female presenting on 11/29/2022 for Dysuria (C/o burning when urinating, urgency and frequency. Sxs started 2022-12-15.)   Husband died a few months ago with brainstem hemorrhage due to aneurysm, Brother in Law also passed away within days of this - this has been a difficult time period for family.   5d h/o incomplete emptying, pain at end of stream, low back pain, urgency, frequency. Some nausea.  No hematuria, fevers/chills, flank pain.  Taking pyridium with benefit.   No recent abx use.  She is on hydrochlorothiazide   Known h/o kidney stones last cystoscopy 2018 [facility]) - had urosepsis at that time.      Relevant past medical, surgical, family and social history reviewed and updated as indicated. Interim medical history since our last visit reviewed. Allergies and medications reviewed and updated. Outpatient Medications Prior to Visit  Medication Sig Dispense Refill   ALPRAZolam (XANAX) 1 MG tablet TAKE 1 TABLET (1 MG TOTAL) BY MOUTH 2 (TWO) TIMES DAILY AS NEEDED FOR ANXIETY OR SLEEP. 60 tablet 1   amLODipine (NORVASC) 5 MG tablet TAKE 1 TABLET (5 MG TOTAL) BY MOUTH DAILY. 90 tablet 3   celecoxib (CELEBREX) 200 MG capsule Take 1 capsule (200 mg total) by mouth daily. 90 capsule 3   cyanocobalamin (VITAMIN B12) 1000 MCG/ML injection 1mL IM every 2 weeks. 6 mL 5   DULoxetine (CYMBALTA) 20 MG capsule Take 1 capsule (20 mg total) by mouth daily. 90 capsule 3   hydrochlorothiazide (HYDRODIURIL) 25 MG tablet TAKE 1 TABLET BY MOUTH EVERY DAY 30 tablet 3   pantoprazole (PROTONIX) 40 MG tablet TAKE 1 TABLET BY MOUTH EVERY DAY 90  tablet 3   Syringe/Needle, Disp, (SYRINGE 3CC/25GX1") 25G X 1" 3 ML MISC Use to inject 1 mL of Vitamin B12 into the muscle every 30 days. 12 each 0   traMADol (ULTRAM) 50 MG tablet TAKE 1 TABLET BY MOUTH DAILY AS NEEDED. 30 tablet 3   No facility-administered medications prior to visit.     Per HPI unless specifically indicated in ROS section below Review of Systems  Objective:  BP 134/72   Pulse 80   Temp 98.4 F (36.9 C) (Oral)   Ht 5\' 6"  (1.676 m)   Wt 183 lb 2 oz (83.1 kg)   SpO2 94%   BMI 29.56 kg/m   Wt Readings from Last 3 Encounters:  11/29/22 183 lb 2 oz (83.1 kg)  01/20/22 185 lb (83.9 kg)  01/18/21 186 lb (84.4 kg)      Physical Exam Vitals and nursing note reviewed.  Constitutional:      Appearance: Normal appearance. She is not ill-appearing.  HENT:     Mouth/Throat:     Mouth: Mucous membranes are moist.     Pharynx: Oropharynx is clear. No oropharyngeal exudate or posterior oropharyngeal erythema.  Abdominal:     General: Bowel sounds are normal. There is no distension.     Palpations: Abdomen is soft. There is no mass.     Tenderness:  There is no abdominal tenderness. There is no right CVA tenderness, left CVA tenderness, guarding or rebound.     Hernia: No hernia is present.  Neurological:     Mental Status: She is alert.  Psychiatric:        Mood and Affect: Mood normal.        Behavior: Behavior normal.       Results for orders placed or performed in visit on 11/29/22  POCT Urinalysis Dipstick (Automated)  Result Value Ref Range   Color, UA Amber    Clarity, UA clear    Glucose, UA Negative Negative   Bilirubin, UA 2+    Ketones, UA 1+    Spec Grav, UA >=1.030 (A) 1.010 - 1.025   Blood, UA 3+    pH, UA 5.0 5.0 - 8.0   Protein, UA Positive (A) Negative   Urobilinogen, UA 4.0 (A) 0.2 or 1.0 E.U./dL   Nitrite, UA positive    Leukocytes, UA Large (3+) (A) Negative    Assessment & Plan:   Problem List Items Addressed This Visit     UTI  (urinary tract infection) - Primary    UA/micro and story concerning for UTI. Rx keflex 500mg  BID 7d course. Given associated nausea, will cover for upper urinary tract involvement with rocephin IM 1gm x1.  Update if not improving with treatment.  Pt agrees with plan.       Relevant Medications   cephALEXin (KEFLEX) 500 MG capsule   Other Relevant Orders   Urine Culture   Grief    Discussed husband's recent passing. Support provided      Other Visit Diagnoses     Dysuria       Relevant Orders   POCT Urinalysis Dipstick (Automated) (Completed)        Meds ordered this encounter  Medications   cephALEXin (KEFLEX) 500 MG capsule    Sig: Take 1 capsule (500 mg total) by mouth 2 (two) times daily.    Dispense:  14 capsule    Refill:  0   cefTRIAXone (ROCEPHIN) injection 1 g    Orders Placed This Encounter  Procedures   Urine Culture   POCT Urinalysis Dipstick (Automated)    Patient Instructions  You do have UTI Urine culture was sent.  Shot of rocephin today followed by keflex twice daily for 7 days.  Push water intake may use tylenol or pyridium for discomfort. Let us know if not improving with treatment.   Follow up plan: Return if symptoms worsen or fail to improve.  Eustaquio Boyden, MD

## 2022-11-29 NOTE — Patient Instructions (Signed)
You do have UTI Urine culture was sent.  Shot of rocephin today followed by keflex twice daily for 7 days.  Push water intake may use tylenol or pyridium for discomfort. Let us know if not improving with treatment.

## 2022-12-01 DIAGNOSIS — M25561 Pain in right knee: Secondary | ICD-10-CM | POA: Diagnosis not present

## 2022-12-01 LAB — URINE CULTURE
MICRO NUMBER:: 15787887
SPECIMEN QUALITY:: ADEQUATE

## 2022-12-08 DIAGNOSIS — S83281A Other tear of lateral meniscus, current injury, right knee, initial encounter: Secondary | ICD-10-CM | POA: Diagnosis not present

## 2022-12-08 DIAGNOSIS — M25561 Pain in right knee: Secondary | ICD-10-CM | POA: Diagnosis not present

## 2022-12-14 ENCOUNTER — Telehealth: Payer: Self-pay | Admitting: Family Medicine

## 2022-12-14 MED ORDER — CEPHALEXIN 500 MG PO CAPS: 500.0000 mg | ORAL_CAPSULE | Freq: Two times a day (BID) | ORAL | 0 refills | Status: DC

## 2022-12-14 NOTE — Telephone Encounter (Signed)
Patient was seen and treated for a UTI on 11/27/ with keflex. She said she has finished the antibiotic,and was feeling better ,however the symptoms of urgency to urinate,pain with urination,and backache has came back.She would like to know if she should come back in to get reevaluated,or if another round of antibiotics could be sent into the pharmacy for her?

## 2022-12-14 NOTE — Telephone Encounter (Signed)
Spoke with pt relaying Dr. G's message.  Pt verbalizes understanding and expresses her thanks.  

## 2022-12-14 NOTE — Telephone Encounter (Addendum)
UCx grew >100k E coli resistant to ampicillin, should be covered by cefazolin. Treated with keflex 500mg  BID 7d course.  Reasonable to do another 7d course - ERx - rec OV if recurrent symptoms after this.  Will cc PCP as Tiffany Campos

## 2022-12-14 NOTE — Addendum Note (Signed)
Addended by: Eustaquio Boyden on: 12/14/2022 11:39 AM   Modules accepted: Orders

## 2022-12-14 NOTE — Telephone Encounter (Signed)
Appreciate help from Dr. Reece Agar.

## 2022-12-16 ENCOUNTER — Other Ambulatory Visit: Payer: Self-pay | Admitting: Family Medicine

## 2023-01-01 DIAGNOSIS — M94261 Chondromalacia, right knee: Secondary | ICD-10-CM | POA: Diagnosis not present

## 2023-01-01 DIAGNOSIS — S83281A Other tear of lateral meniscus, current injury, right knee, initial encounter: Secondary | ICD-10-CM | POA: Diagnosis not present

## 2023-01-01 DIAGNOSIS — S83271A Complex tear of lateral meniscus, current injury, right knee, initial encounter: Secondary | ICD-10-CM | POA: Diagnosis not present

## 2023-01-07 ENCOUNTER — Other Ambulatory Visit: Payer: Self-pay | Admitting: Family Medicine

## 2023-01-11 DIAGNOSIS — M25561 Pain in right knee: Secondary | ICD-10-CM | POA: Diagnosis not present

## 2023-01-11 DIAGNOSIS — M25661 Stiffness of right knee, not elsewhere classified: Secondary | ICD-10-CM | POA: Diagnosis not present

## 2023-01-14 ENCOUNTER — Other Ambulatory Visit: Payer: Self-pay | Admitting: Family Medicine

## 2023-01-14 DIAGNOSIS — I10 Essential (primary) hypertension: Secondary | ICD-10-CM

## 2023-01-14 DIAGNOSIS — E538 Deficiency of other specified B group vitamins: Secondary | ICD-10-CM

## 2023-01-16 ENCOUNTER — Other Ambulatory Visit (INDEPENDENT_AMBULATORY_CARE_PROVIDER_SITE_OTHER): Payer: 59

## 2023-01-16 DIAGNOSIS — I10 Essential (primary) hypertension: Secondary | ICD-10-CM | POA: Diagnosis not present

## 2023-01-16 DIAGNOSIS — E538 Deficiency of other specified B group vitamins: Secondary | ICD-10-CM

## 2023-01-16 LAB — CBC WITH DIFFERENTIAL/PLATELET
Basophils Absolute: 0.1 10*3/uL (ref 0.0–0.1)
Basophils Relative: 1.8 % (ref 0.0–3.0)
Eosinophils Absolute: 0.2 10*3/uL (ref 0.0–0.7)
Eosinophils Relative: 3.9 % (ref 0.0–5.0)
HCT: 48.3 % — ABNORMAL HIGH (ref 36.0–46.0)
Hemoglobin: 16 g/dL — ABNORMAL HIGH (ref 12.0–15.0)
Lymphocytes Relative: 29 % (ref 12.0–46.0)
Lymphs Abs: 1.8 10*3/uL (ref 0.7–4.0)
MCHC: 33.1 g/dL (ref 30.0–36.0)
MCV: 99.4 fL (ref 78.0–100.0)
Monocytes Absolute: 0.5 10*3/uL (ref 0.1–1.0)
Monocytes Relative: 7.9 % (ref 3.0–12.0)
Neutro Abs: 3.6 10*3/uL (ref 1.4–7.7)
Neutrophils Relative %: 57.4 % (ref 43.0–77.0)
Platelets: 283 10*3/uL (ref 150.0–400.0)
RBC: 4.86 Mil/uL (ref 3.87–5.11)
RDW: 13.3 % (ref 11.5–15.5)
WBC: 6.3 10*3/uL (ref 4.0–10.5)

## 2023-01-16 LAB — COMPREHENSIVE METABOLIC PANEL
ALT: 19 U/L (ref 0–35)
AST: 25 U/L (ref 0–37)
Albumin: 4.4 g/dL (ref 3.5–5.2)
Alkaline Phosphatase: 100 U/L (ref 39–117)
BUN: 15 mg/dL (ref 6–23)
CO2: 30 meq/L (ref 19–32)
Calcium: 9.4 mg/dL (ref 8.4–10.5)
Chloride: 101 meq/L (ref 96–112)
Creatinine, Ser: 0.72 mg/dL (ref 0.40–1.20)
GFR: 88.7 mL/min (ref >60.00)
Glucose, Bld: 91 mg/dL (ref 70–99)
Potassium: 3.9 meq/L (ref 3.5–5.1)
Sodium: 143 meq/L (ref 135–145)
Total Bilirubin: 0.8 mg/dL (ref 0.2–1.2)
Total Protein: 7.1 g/dL (ref 6.0–8.3)

## 2023-01-16 LAB — LIPID PANEL
Cholesterol: 277 mg/dL — ABNORMAL HIGH (ref 0–200)
HDL: 85.7 mg/dL (ref >39.00)
LDL Cholesterol: 170 mg/dL — ABNORMAL HIGH (ref 0–99)
NonHDL: 191.25
Total CHOL/HDL Ratio: 3
Triglycerides: 104 mg/dL (ref 0.0–149.0)
VLDL: 20.8 mg/dL (ref 0.0–40.0)

## 2023-01-16 LAB — VITAMIN B12: Vitamin B-12: 418 pg/mL (ref 211–911)

## 2023-01-23 ENCOUNTER — Encounter: Payer: Self-pay | Admitting: Family Medicine

## 2023-01-23 ENCOUNTER — Ambulatory Visit (INDEPENDENT_AMBULATORY_CARE_PROVIDER_SITE_OTHER): Payer: 59 | Admitting: Family Medicine

## 2023-01-23 VITALS — BP 136/82 | HR 68 | Ht 66.0 in | Wt 185.4 lb

## 2023-01-23 DIAGNOSIS — E785 Hyperlipidemia, unspecified: Secondary | ICD-10-CM

## 2023-01-23 DIAGNOSIS — M25569 Pain in unspecified knee: Secondary | ICD-10-CM

## 2023-01-23 DIAGNOSIS — F419 Anxiety disorder, unspecified: Secondary | ICD-10-CM

## 2023-01-23 DIAGNOSIS — I10 Essential (primary) hypertension: Secondary | ICD-10-CM

## 2023-01-23 DIAGNOSIS — E538 Deficiency of other specified B group vitamins: Secondary | ICD-10-CM

## 2023-01-23 DIAGNOSIS — Z7189 Other specified counseling: Secondary | ICD-10-CM

## 2023-01-23 DIAGNOSIS — Z0000 Encounter for general adult medical examination without abnormal findings: Secondary | ICD-10-CM | POA: Diagnosis not present

## 2023-01-23 MED ORDER — HYDROCHLOROTHIAZIDE 25 MG PO TABS: ORAL_TABLET | ORAL | 3 refills | Status: DC

## 2023-01-23 MED ORDER — PANTOPRAZOLE SODIUM 40 MG PO TBEC: DELAYED_RELEASE_TABLET | ORAL | 3 refills | Status: DC

## 2023-01-23 MED ORDER — DULOXETINE HCL 20 MG PO CPEP: 20.0000 mg | ORAL_CAPSULE | Freq: Every day | ORAL | 3 refills | Status: DC

## 2023-01-23 MED ORDER — CELECOXIB 200 MG PO CAPS: 200.0000 mg | ORAL_CAPSULE | Freq: Every day | ORAL | 3 refills | Status: DC

## 2023-01-23 MED ORDER — CYANOCOBALAMIN 1000 MCG/ML IJ SOLN: INTRAMUSCULAR | 5 refills | Status: DC

## 2023-01-23 MED ORDER — AMLODIPINE BESYLATE 5 MG PO TABS: 5.0000 mg | ORAL_TABLET | Freq: Every day | ORAL | 3 refills | Status: DC

## 2023-01-23 NOTE — Progress Notes (Unsigned)
CPE- See plan.  Routine anticipatory guidance given to patient.  See health maintenance.  The possibility exists that previously documented standard health maintenance information may have been brought forward from a previous encounter into this note.  If needed, that same information has been updated to reflect the current situation based on today's encounter.    Tetanus 2023?  Check on the old records.  Flu 2024 PNA not due shingles d/w pt.  covid vaccine prev done.   Colonoscopy 2023 Pap not due, d/w pt.  s/p hysterectomy.   DXA not due  Mammogram pending Living will d/w pt.  Daughter Dorita Fray designated if patient were incapacitated.    She had knee surgery recently, d/w pt.  Per ortho.  She has made some progress.    Anxiety. Still on cymbalta and xanax.   Her husband and his brother both passed last year.  Condolences offered.  She is living alone.    Hypertension:    Using medication without problems or lightheadedness: yes Chest pain with exertion:no Edema:no Short of breath:no  B12 def. Still on replacement.  She had trough level.  No trouble giving her dose at home.    PMH and SH reviewed  Meds, vitals, and allergies reviewed.   ROS: Per HPI.  Unless specifically indicated otherwise in HPI, the patient denies:  General: fever. Eyes: acute vision changes ENT: sore throat Cardiovascular: chest pain Respiratory: SOB GI: vomiting GU: dysuria Musculoskeletal: acute back pain Derm: acute rash Neuro: acute motor dysfunction Psych: worsening mood Endocrine: polydipsia Heme: bleeding Allergy: hayfever  GEN: nad, alert and oriented HEENT: mucous membranes moist NECK: supple w/o LA CV: rrr. PULM: ctab, no inc wob ABD: soft, +bs EXT: no edema SKIN: no acute rash  The 10-year ASCVD risk score (Arnett DK, et al., 2019) is: 6.6%   Values used to calculate the score:     Age: 63 years     Sex: Female     Is Non-Hispanic African American: No     Diabetic: No      Tobacco smoker: No     Systolic Blood Pressure: 136 mmHg     Is BP treated: Yes     HDL Cholesterol: 85.7 mg/dL     Total Cholesterol: 277 mg/dL

## 2023-01-23 NOTE — Patient Instructions (Addendum)
Ask the front about getting Scarlett on your contact list.  Take care.  Glad to see you. Try to exercise when your knee allows and recheck lipids at a fasting lab visit in about 4 months.

## 2023-01-24 DIAGNOSIS — E785 Hyperlipidemia, unspecified: Secondary | ICD-10-CM | POA: Insufficient documentation

## 2023-01-24 DIAGNOSIS — M25569 Pain in unspecified knee: Secondary | ICD-10-CM | POA: Insufficient documentation

## 2023-01-24 NOTE — Assessment & Plan Note (Signed)
Continue cymbalta and xanax.   Her husband and his brother both passed last year.  Condolences offered.  She is living alone.  Safe at home.  No SI/HI. Okay for outpatient f/u.

## 2023-01-24 NOTE — Assessment & Plan Note (Signed)
S/p arthroscopy, continue with celebrex daily.

## 2023-01-24 NOTE — Assessment & Plan Note (Signed)
Tetanus 2023 per old records.   Flu 2024 PNA not due shingles d/w pt.  covid vaccine prev done.   Colonoscopy 2023 Pap not due, d/w pt.  s/p hysterectomy.   DXA not due  Mammogram pending Living will d/w pt.  Daughter Dorita Fray designated if patient were incapacitated.

## 2023-01-24 NOTE — Assessment & Plan Note (Signed)
Continue amlodipine and hydrochlorothiazide. Labs d/ wpt.

## 2023-01-24 NOTE — Assessment & Plan Note (Signed)
Living will d/w pt.  Daughter Dorita Fray designated if patient were incapacitated.

## 2023-01-24 NOTE — Assessment & Plan Note (Signed)
B12 def. Still on replacement.  She had trough level.  No trouble giving her dose at home.  Continue as is.

## 2023-01-24 NOTE — Assessment & Plan Note (Signed)
Ascvd d/w pt.  D/w pt about continued work on diet and exercise.

## 2023-01-26 ENCOUNTER — Ambulatory Visit: Payer: Self-pay | Admitting: Family Medicine

## 2023-01-26 NOTE — Telephone Encounter (Signed)
  Chief Complaint: urinary symptoms Symptoms: pain with urination, urinary urgency, burning, discomfort Frequency: began this morning Pertinent Negatives: Patient denies fever, nausea, vomiting, diarrhea Disposition: [] ED /[] Urgent Care (no appt availability in office) / [x] Appointment(In office/virtual)/ []  [facility]/ [] Home Care/ [] Refused Recommended Disposition /[] [facility]/ []  Follow-up with PCP Additional Notes: Patient called reporting pain with urination, increased urgency, discomfort that began this morning. Patient reports that she has had multiple UTIs recently and this feels the same. Per protocol, patient to be evaluated within 24 hours. Patient scheduled with virtual urgent care at patient request for tomorrow at 0900. Care advice reviewed, patient verbalized understanding. Alerting PCP for review.   Copied from CRM [id number]. Topic: Clinical - Red Word Triage >> Jan 26, 2023  1:45 PM Irine Seal wrote: [facility] that prompted transfer to Nurse Triage: sudden onset of Urinary Tract Infection symptoms, pt has 2 previous UTI's in Nov & Dec of 2024.  Pt stated it came abruptly, no slow progression of symptoms, she is experiencing pain when urinating, burning, discomfort, and irritation, she stated she is miserable. Reason for Disposition  Urinating more frequently than usual (i.e., frequency)  Answer Assessment - Initial Assessment Questions 1. SYMPTOM: "What's the main symptom you're concerned about?" (e.g., frequency, incontinence)     Pain with urination, burning, discomfort, irritation, urinary urgency 2. ONSET: "When did the  symptoms  start?"     This morning 3. PAIN: "Is there any pain?" If Yes, ask: "How bad is it?" (Scale: 1-10; mild, moderate, severe)     1/10 4. CAUSE: "What do you think is causing the symptoms?"     UTI, states that she has recurrent UTIs. 5. OTHER SYMPTOMS: "Do you have any other symptoms?" (e.g., blood in urine, fever, flank pain,  pain with urination)     Pain with urination  Protocols used: Urinary Symptoms-A-AH

## 2023-01-27 ENCOUNTER — Telehealth: Payer: 59 | Admitting: Nurse Practitioner

## 2023-01-27 DIAGNOSIS — R399 Unspecified symptoms and signs involving the genitourinary system: Secondary | ICD-10-CM | POA: Diagnosis not present

## 2023-01-27 MED ORDER — SULFAMETHOXAZOLE-TRIMETHOPRIM 800-160 MG PO TABS: 1.0000 | ORAL_TABLET | Freq: Two times a day (BID) | ORAL | 0 refills | Status: AC

## 2023-01-27 NOTE — Patient Instructions (Signed)
  Tiffany Campos, thank you for joining Claiborne Rigg, NP for today's virtual visit.  While this provider is not your primary care provider (PCP), if your PCP is located in our provider database this encounter information will be shared with them immediately following your visit.   A [facility] MyChart account gives you access to today's visit and all your visits, tests, and labs performed at [facility] " click here if you don't have a [facility] MyChart account or go to [email or web address]  Consent: (Patient) Tiffany Campos provided verbal consent for this virtual visit at the beginning of the encounter.  Current Medications:  Current Outpatient Medications:    sulfamethoxazole-trimethoprim (BACTRIM DS) 800-160 MG tablet, Take 1 tablet by mouth 2 (two) times daily for 7 days., Disp: 14 tablet, Rfl: 0   ALPRAZolam (XANAX) 1 MG tablet, TAKE 1 TABLET (1 MG TOTAL) BY MOUTH 2 (TWO) TIMES DAILY AS NEEDED FOR ANXIETY OR SLEEP., Disp: 60 tablet, Rfl: 1   amLODipine (NORVASC) 5 MG tablet, Take 1 tablet (5 mg total) by mouth daily., Disp: 90 tablet, Rfl: 3   celecoxib (CELEBREX) 200 MG capsule, Take 1 capsule (200 mg total) by mouth daily., Disp: 90 capsule, Rfl: 3   cyanocobalamin (VITAMIN B12) 1000 MCG/ML injection, 1mL IM every 2 weeks., Disp: 6 mL, Rfl: 5   DULoxetine (CYMBALTA) 20 MG capsule, Take 1 capsule (20 mg total) by mouth daily., Disp: 90 capsule, Rfl: 3   hydrochlorothiazide (HYDRODIURIL) 25 MG tablet, TAKE 1 TABLET BY MOUTH EVERY DAY, Disp: 90 tablet, Rfl: 3   pantoprazole (PROTONIX) 40 MG tablet, TAKE 1 TABLET BY MOUTH EVERY DAY, Disp: 90 tablet, Rfl: 3   Syringe/Needle, Disp, (SYRINGE 3CC/25GX1") 25G X 1" 3 ML MISC, Use to inject 1 mL of Vitamin B12 into the muscle every 30 days., Disp: 12 each, Rfl: 0   traMADol (ULTRAM) 50 MG tablet, TAKE 1 TABLET BY MOUTH DAILY AS NEEDED., Disp: 30 tablet, Rfl: 3   Medications ordered in this encounter:  Meds ordered this  encounter  Medications   sulfamethoxazole-trimethoprim (BACTRIM DS) 800-160 MG tablet    Sig: Take 1 tablet by mouth 2 (two) times daily for 7 days.    Dispense:  14 tablet    Refill:  0    Supervising Provider:   Merrilee Jansky [[id number]]     *If you need refills on other medications prior to your next appointment, please contact your pharmacy*  Follow-Up: Call back or seek an in-person evaluation if the symptoms worsen or if the condition fails to improve as anticipated.  [facility] [phone]   If you have been instructed to have an in-person evaluation today at a local Urgent Care facility, please use the link below. It will take you to a list of all of our available [facility], including address, phone number and hours of operation. Please do not delay care.  [facility]  If you or a family member do not have a primary care provider, use the link below to schedule a visit and establish care. When you choose a [facility] primary care physician or advanced practice provider, you gain a long-term partner in health. Find a Primary Care Provider  Learn more about [facility]'s in-office and virtual care options: [facility] - Get Care Now

## 2023-01-27 NOTE — Progress Notes (Signed)
Virtual Visit Consent   Tiffany Campos, you are scheduled for a virtual visit with a [facility] provider today. Just as with appointments in the office, your consent must be obtained to participate. Your consent will be active for this visit and any virtual visit you may have with one of our providers in the next 365 days. If you have a MyChart account, a copy of this consent can be sent to you electronically.  As this is a virtual visit, video technology does not allow for your provider to perform a traditional examination. This may limit your provider's ability to fully assess your condition. If your provider identifies any concerns that need to be evaluated in person or the need to arrange testing (such as labs, EKG, etc.), we will make arrangements to do so. Although advances in technology are sophisticated, we cannot ensure that it will always work on either your end or our end. If the connection with a video visit is poor, the visit may have to be switched to a telephone visit. With either a video or telephone visit, we are not always able to ensure that we have a secure connection.  By engaging in this virtual visit, you consent to the provision of healthcare and authorize for your insurance to be billed (if applicable) for the services provided during this visit. Depending on your insurance coverage, you may receive a charge related to this service.  I need to obtain your verbal consent now. Are you willing to proceed with your visit today? Tiffany Campos has provided verbal consent on 01/27/2023 for a virtual visit (video or telephone). Claiborne Rigg, NP  Date: 01/27/2023 9:13 AM  Virtual Visit via Video Note   I, Claiborne Rigg, connected with  Tiffany Campos  ([id number], [birth year]) on 01/27/23 at  9:00 AM EST by a video-enabled telemedicine application and verified that I am speaking with the correct person using two identifiers.  Location: Patient: Virtual Visit Location  Patient: Home Provider: Virtual Visit Location Provider: Home Office   I discussed the limitations of evaluation and management by telemedicine and the availability of in person appointments. The patient expressed understanding and agreed to proceed.    History of Present Illness: Tiffany Campos is a 63 y.o. who identifies as a female who was assigned female at birth, and is being seen today for UTI symptoms.  Ms.Helt started experiencing dysuria and urgency yesterday. She started taking azo and symptoms have resolved. She states there was a dark stream of urine on her pad this morning and she is not sure what that was related to but she is no longer experiencing the strong sense of dysuria and urgency this morning. No vaginal bleeding.    Problems:  Patient Active Problem List   Diagnosis Date Noted   HLD (hyperlipidemia) 01/24/2023   Knee pain 01/24/2023   Grief 11/29/2022   Anxiety 01/19/2021   Ureteral calculus 12/24/2016   Arthritis of carpometacarpal (CMC) joints of both thumbs 02/29/2016   Low back pain 11/05/2015   Fatigue 07/21/2015   B12 deficiency 07/21/2015   Routine general medical examination at a health care facility 11/11/2014   Advance care planning 11/11/2014   Allergic dermatitis 11/03/2014   Essential hypertension 11/06/2013   OA (osteoarthritis) 11/06/2013   GERD (gastroesophageal reflux disease) 11/06/2013   Lapband APS + [facility] repair August 2011 03/13/2013    Allergies:  Allergies  Allergen Reactions   Lexapro [Escitalopram Oxalate] Other (See Comments)    Worsening mood  Lodine [Etodolac] Other (See Comments)    GERD- tolerates celebrex   Naproxen Other (See Comments)    GERD- tolerates celebrex   Medications:  Current Outpatient Medications:    sulfamethoxazole-trimethoprim (BACTRIM DS) 800-160 MG tablet, Take 1 tablet by mouth 2 (two) times daily for 7 days., Disp: 14 tablet, Rfl: 0   ALPRAZolam (XANAX) 1 MG tablet, TAKE 1 TABLET (1 MG TOTAL)  BY MOUTH 2 (TWO) TIMES DAILY AS NEEDED FOR ANXIETY OR SLEEP., Disp: 60 tablet, Rfl: 1   amLODipine (NORVASC) 5 MG tablet, Take 1 tablet (5 mg total) by mouth daily., Disp: 90 tablet, Rfl: 3   celecoxib (CELEBREX) 200 MG capsule, Take 1 capsule (200 mg total) by mouth daily., Disp: 90 capsule, Rfl: 3   cyanocobalamin (VITAMIN B12) 1000 MCG/ML injection, 1mL IM every 2 weeks., Disp: 6 mL, Rfl: 5   DULoxetine (CYMBALTA) 20 MG capsule, Take 1 capsule (20 mg total) by mouth daily., Disp: 90 capsule, Rfl: 3   hydrochlorothiazide (HYDRODIURIL) 25 MG tablet, TAKE 1 TABLET BY MOUTH EVERY DAY, Disp: 90 tablet, Rfl: 3   pantoprazole (PROTONIX) 40 MG tablet, TAKE 1 TABLET BY MOUTH EVERY DAY, Disp: 90 tablet, Rfl: 3   Syringe/Needle, Disp, (SYRINGE 3CC/25GX1") 25G X 1" 3 ML MISC, Use to inject 1 mL of Vitamin B12 into the muscle every 30 days., Disp: 12 each, Rfl: 0   traMADol (ULTRAM) 50 MG tablet, TAKE 1 TABLET BY MOUTH DAILY AS NEEDED., Disp: 30 tablet, Rfl: 3  Observations/Objective: Patient is well-developed, well-nourished in no acute distress.  Resting comfortably at home.  Head is normocephalic, atraumatic.  No labored breathing.  Speech is clear and coherent with logical content.  Patient is alert and oriented at baseline.    Assessment and Plan: 1. UTI symptoms (Primary) - sulfamethoxazole-trimethoprim (BACTRIM DS) 800-160 MG tablet; Take 1 tablet by mouth 2 (two) times daily for 7 days.  Dispense: 14 tablet; Refill: 0    Follow Up Instructions: I discussed the assessment and treatment plan with the patient. The patient was provided an opportunity to ask questions and all were answered. The patient agreed with the plan and demonstrated an understanding of the instructions.  A copy of instructions were sent to the patient via MyChart unless otherwise noted below.   The patient was advised to call back or seek an in-person evaluation if the symptoms worsen or if the condition fails to improve  as anticipated.    Claiborne Rigg, NP

## 2023-02-05 DIAGNOSIS — Z1231 Encounter for screening mammogram for malignant neoplasm of breast: Secondary | ICD-10-CM | POA: Diagnosis not present

## 2023-02-05 LAB — HM MAMMOGRAPHY

## 2023-02-06 ENCOUNTER — Encounter: Payer: Self-pay | Admitting: Family Medicine

## 2023-02-13 DIAGNOSIS — M25551 Pain in right hip: Secondary | ICD-10-CM | POA: Diagnosis not present

## 2023-02-23 ENCOUNTER — Other Ambulatory Visit: Payer: Self-pay | Admitting: Family Medicine

## 2023-02-23 NOTE — Telephone Encounter (Signed)
 Last refill: TRAMADOL HCL 50 MG TABLET 08/08/22 30 tablets 3 refills Last office visit: 01/23/23 Next office visit: nothing scheduled

## 2023-04-29 ENCOUNTER — Other Ambulatory Visit: Payer: Self-pay | Admitting: Family Medicine

## 2023-04-30 NOTE — Telephone Encounter (Signed)
 LOV:01/23/23 NOV: nothing scheduled LAST REFILL: ALPRAZOLAM  1 MG TABLET 12/19/22  60 tablets 1 refill

## 2023-05-06 ENCOUNTER — Other Ambulatory Visit: Payer: Self-pay | Admitting: Family Medicine

## 2023-05-22 ENCOUNTER — Other Ambulatory Visit: Payer: 59

## 2023-11-27 ENCOUNTER — Encounter: Payer: Self-pay | Admitting: Family Medicine

## 2023-11-27 NOTE — Telephone Encounter (Signed)
 Printed form and placed in Dr Elfredia in box on his desk.

## 2023-11-28 NOTE — Telephone Encounter (Signed)
 Patient arrived in office to pick up placecard ppwk

## 2023-12-01 ENCOUNTER — Other Ambulatory Visit: Payer: Self-pay | Admitting: Family Medicine

## 2023-12-01 DIAGNOSIS — F419 Anxiety disorder, unspecified: Secondary | ICD-10-CM

## 2023-12-01 DIAGNOSIS — M158 Other polyosteoarthritis: Secondary | ICD-10-CM

## 2023-12-05 NOTE — Telephone Encounter (Signed)
 Name of Medication:  Alprazolam , Tramadol  Name of Pharmacy:  [facility] Last Fill or Written Date and Quantity:       Alprazolam - 06/30/23, #60      Tramadol - 07/01/23, #30 Last Office Visit and Type:  01/23/23, CPE Next Office Visit and Type:  none Last Controlled Substance Agreement Date:  none Last UDS:  none

## 2023-12-21 ENCOUNTER — Other Ambulatory Visit: Payer: Self-pay | Admitting: Family Medicine

## 2023-12-21 DIAGNOSIS — E538 Deficiency of other specified B group vitamins: Secondary | ICD-10-CM

## 2023-12-24 ENCOUNTER — Other Ambulatory Visit (HOSPITAL_COMMUNITY): Payer: Self-pay

## 2023-12-24 NOTE — Telephone Encounter (Signed)
 I signed the rx.  Please start the PA.  Thanks.

## 2023-12-24 NOTE — Telephone Encounter (Signed)
 Noted. I will forward message to Rx PA Team.

## 2023-12-24 NOTE — Telephone Encounter (Signed)
 Message from pharmacy:  Alternative Requested:PRIOR AUTH.   Dr Cleatus, do you want a PA submitted?

## 2023-12-25 ENCOUNTER — Other Ambulatory Visit (HOSPITAL_COMMUNITY): Payer: Self-pay

## 2023-12-25 ENCOUNTER — Telehealth: Payer: Self-pay

## 2023-12-25 NOTE — Telephone Encounter (Signed)
 Pharmacy Patient Advocate Encounter   Received notification from Pt Calls Messages that prior authorization for Cyanocobalamin  1000 inj is required/requested.   Unable to verify patient insurance. Patient [employer] from media has expired.    Per test claim:

## 2023-12-31 ENCOUNTER — Other Ambulatory Visit (HOSPITAL_COMMUNITY): Payer: Self-pay

## 2024-01-04 ENCOUNTER — Telehealth: Payer: Self-pay

## 2024-01-04 DIAGNOSIS — E538 Deficiency of other specified B group vitamins: Secondary | ICD-10-CM

## 2024-01-04 DIAGNOSIS — I10 Essential (primary) hypertension: Secondary | ICD-10-CM

## 2024-01-04 NOTE — Telephone Encounter (Signed)
 Copied from CRM [id number]. Topic: Clinical - Request for Lab/Test Order >> Jan 04, 2024  9:54 AM Maisie BROCKS wrote: Reason for CRM: Pt called and scheduled her CPE and requested for labs to be ordered so she can  Have a lab appt a week before visit w/ pcp. Please call and advise when ready to schedule.

## 2024-01-06 NOTE — Addendum Note (Signed)
 Addended by: CLEATUS LORELI RAMAN on: 01/06/2024 08:31 PM   Modules accepted: Orders

## 2024-01-06 NOTE — Telephone Encounter (Signed)
 Ordered the labs. Please schedule.  Thanks.

## 2024-01-09 ENCOUNTER — Other Ambulatory Visit: Payer: Self-pay | Admitting: Family Medicine

## 2024-01-13 ENCOUNTER — Other Ambulatory Visit: Payer: Self-pay | Admitting: Family Medicine

## 2024-01-13 DIAGNOSIS — I10 Essential (primary) hypertension: Secondary | ICD-10-CM

## 2024-01-17 ENCOUNTER — Other Ambulatory Visit (INDEPENDENT_AMBULATORY_CARE_PROVIDER_SITE_OTHER)

## 2024-01-17 ENCOUNTER — Ambulatory Visit: Payer: Self-pay | Admitting: Family Medicine

## 2024-01-17 DIAGNOSIS — I10 Essential (primary) hypertension: Secondary | ICD-10-CM

## 2024-01-17 DIAGNOSIS — E538 Deficiency of other specified B group vitamins: Secondary | ICD-10-CM | POA: Diagnosis not present

## 2024-01-17 LAB — CBC WITH DIFFERENTIAL/PLATELET
Basophils Absolute: 0.1 K/uL (ref 0.0–0.1)
Basophils Relative: 1.2 % (ref 0.0–3.0)
Eosinophils Absolute: 0.2 K/uL (ref 0.0–0.7)
Eosinophils Relative: 3.9 % (ref 0.0–5.0)
HCT: 48 % — ABNORMAL HIGH (ref 36.0–46.0)
Hemoglobin: 16.2 g/dL — ABNORMAL HIGH (ref 12.0–15.0)
Lymphocytes Relative: 28.6 % (ref 12.0–46.0)
Lymphs Abs: 1.7 K/uL (ref 0.7–4.0)
MCHC: 33.8 g/dL (ref 30.0–36.0)
MCV: 95 fl (ref 78.0–100.0)
Monocytes Absolute: 0.5 K/uL (ref 0.1–1.0)
Monocytes Relative: 7.7 % (ref 3.0–12.0)
Neutro Abs: 3.5 K/uL (ref 1.4–7.7)
Neutrophils Relative %: 58.6 % (ref 43.0–77.0)
Platelets: 265 K/uL (ref 150.0–400.0)
RBC: 5.05 Mil/uL (ref 3.87–5.11)
RDW: 13.2 % (ref 11.5–15.5)
WBC: 6 K/uL (ref 4.0–10.5)

## 2024-01-17 LAB — COMPREHENSIVE METABOLIC PANEL WITH GFR
ALT: 15 U/L (ref 3–35)
AST: 20 U/L (ref 5–37)
Albumin: 4.6 g/dL (ref 3.5–5.2)
Alkaline Phosphatase: 97 U/L (ref 39–117)
BUN: 17 mg/dL (ref 6–23)
CO2: 33 meq/L — ABNORMAL HIGH (ref 19–32)
Calcium: 9.4 mg/dL (ref 8.4–10.5)
Chloride: 101 meq/L (ref 96–112)
Creatinine, Ser: 0.74 mg/dL (ref 0.40–1.20)
GFR: 85.23 mL/min
Glucose, Bld: 86 mg/dL (ref 70–99)
Potassium: 3.8 meq/L (ref 3.5–5.1)
Sodium: 140 meq/L (ref 135–145)
Total Bilirubin: 0.7 mg/dL (ref 0.2–1.2)
Total Protein: 7.1 g/dL (ref 6.0–8.3)

## 2024-01-17 LAB — LIPID PANEL
Cholesterol: 254 mg/dL — ABNORMAL HIGH (ref 28–200)
HDL: 92.2 mg/dL
LDL Cholesterol: 150 mg/dL — ABNORMAL HIGH (ref 10–99)
NonHDL: 162.2
Total CHOL/HDL Ratio: 3
Triglycerides: 61 mg/dL (ref 10.0–149.0)
VLDL: 12.2 mg/dL (ref 0.0–40.0)

## 2024-01-17 LAB — VITAMIN B12: Vitamin B-12: 285 pg/mL (ref 211–911)

## 2024-01-24 ENCOUNTER — Encounter: Payer: Self-pay | Admitting: Family Medicine

## 2024-01-24 ENCOUNTER — Ambulatory Visit: Admitting: Family Medicine

## 2024-01-24 VITALS — BP 122/80 | HR 71 | Temp 98.8°F | Ht 65.5 in | Wt 188.1 lb

## 2024-01-24 DIAGNOSIS — R5383 Other fatigue: Secondary | ICD-10-CM

## 2024-01-24 DIAGNOSIS — Z0000 Encounter for general adult medical examination without abnormal findings: Secondary | ICD-10-CM

## 2024-01-24 DIAGNOSIS — F419 Anxiety disorder, unspecified: Secondary | ICD-10-CM | POA: Diagnosis not present

## 2024-01-24 DIAGNOSIS — E538 Deficiency of other specified B group vitamins: Secondary | ICD-10-CM

## 2024-01-24 DIAGNOSIS — I10 Essential (primary) hypertension: Secondary | ICD-10-CM | POA: Diagnosis not present

## 2024-01-24 DIAGNOSIS — R197 Diarrhea, unspecified: Secondary | ICD-10-CM | POA: Diagnosis not present

## 2024-01-24 DIAGNOSIS — D518 Other vitamin B12 deficiency anemias: Secondary | ICD-10-CM | POA: Diagnosis not present

## 2024-01-24 DIAGNOSIS — Z0001 Encounter for general adult medical examination with abnormal findings: Secondary | ICD-10-CM

## 2024-01-24 DIAGNOSIS — Z23 Encounter for immunization: Secondary | ICD-10-CM | POA: Diagnosis not present

## 2024-01-24 DIAGNOSIS — Z7189 Other specified counseling: Secondary | ICD-10-CM

## 2024-01-24 DIAGNOSIS — R0602 Shortness of breath: Secondary | ICD-10-CM

## 2024-01-24 MED ORDER — PANTOPRAZOLE SODIUM 40 MG PO TBEC: DELAYED_RELEASE_TABLET | ORAL | 3 refills | Status: AC

## 2024-01-24 MED ORDER — ALPRAZOLAM 1 MG PO TABS: 1.0000 mg | ORAL_TABLET | Freq: Two times a day (BID) | ORAL | 1 refills | Status: AC | PRN

## 2024-01-24 MED ORDER — CHOLESTYRAMINE 4 G PO PACK: 4.0000 g | PACK | Freq: Every day | ORAL | 12 refills | Status: AC

## 2024-01-24 MED ORDER — CELECOXIB 200 MG PO CAPS: 200.0000 mg | ORAL_CAPSULE | Freq: Every day | ORAL | 3 refills | Status: AC

## 2024-01-24 MED ORDER — DULOXETINE HCL 20 MG PO CPEP: 20.0000 mg | ORAL_CAPSULE | Freq: Every day | ORAL | 3 refills | Status: AC

## 2024-01-24 MED ORDER — AMLODIPINE BESYLATE 5 MG PO TABS: 5.0000 mg | ORAL_TABLET | Freq: Every day | ORAL | 3 refills | Status: AC

## 2024-01-24 NOTE — Assessment & Plan Note (Signed)
 Still on replacement but she had occ missed a dose.  Unclear if that contributes to her fatigue.   Would restart at home.  If she sees sig change with first dose, she could give weekly at home up to 4 doses then restart monthly dose.

## 2024-01-24 NOTE — Patient Instructions (Addendum)
 Check with your insurance to see if they will cover the shingles shot. Pneumonia-20 when possible.  Tetanus shot today.  Take care.  Glad to see you.  Let me know if you have trouble getting your mammogram.   Go to the lab on the way out.   If you have mychart we'll likely use that to update you.     Try questran  at night.  See if that helps with mornings.

## 2024-01-24 NOTE — Assessment & Plan Note (Signed)
 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  Worse after prev cholecystectomy.  Going on for years.  No new sx.   Can try questran  at night and see if that helps with AM symptoms.

## 2024-01-24 NOTE — Assessment & Plan Note (Signed)
Continue cymbalta and xanax

## 2024-01-24 NOTE — Assessment & Plan Note (Signed)
 Tetanus- 2026 Flu 2025 RSV prev done.  PNA d/w pt, could be done at 64.   shingles d/w pt.  covid vaccine prev done.   Colonoscopy 2023 Pap not due, d/w pt.  s/p hysterectomy.   DXA not due  Mammogram pending Living will d/w pt.  Daughter Ollie designated if patient were incapacitated.

## 2024-01-24 NOTE — Assessment & Plan Note (Signed)
 See notes on labs.  Continue amlodipine  and hydrochlorothiazide .

## 2024-01-24 NOTE — Assessment & Plan Note (Signed)
 Living will d/w pt.  Daughter Dorita Fray designated if patient were incapacitated.

## 2024-01-24 NOTE — Progress Notes (Signed)
 CPE- See plan.  Routine anticipatory guidance given to patient.  See health maintenance.  The possibility exists that previously documented standard health maintenance information may have been brought forward from a previous encounter into this note.  If needed, that same information has been updated to reflect the current situation based on today's encounter.    Tetanus- 2026 Flu 2025 RSV prev done.  PNA d/w pt, could be done at 64.   shingles d/w pt.  covid vaccine prev done.   Colonoscopy 2023 Pap not due, d/w pt.  s/p hysterectomy.   DXA not due  Mammogram pending Living will d/w pt.  Daughter Ollie designated if patient were incapacitated.    She is still putting up with knee pain and L plantar fasciitis with prev injection done.  She is using inserts and stretching.     Anxiety. Still on cymbalta  and xanax .  Her husband and his brother both passed. Condolences offered.  She is living alone.  Discussed continuing both meds.    Hypertension:               Using medication without problems or lightheadedness: yes Chest pain with exertion:no Edema:no Short of breath:she has some SOB and fatigue. Recheck labs pending.    B12 def. Still on replacement but she had occ missed a dose.  Unclear if that contributes to her fatigue.    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  Worse after prev cholecystectomy.  Going on for years.  No new sx.    PMH and SH reviewed  Meds, vitals, and allergies reviewed.   ROS: Per HPI.  Unless specifically indicated otherwise in HPI, the patient denies:  General: fever. Eyes: acute vision changes ENT: sore throat Cardiovascular: chest pain Respiratory: SOB GI: vomiting GU: dysuria Musculoskeletal: acute back pain Derm: acute rash Neuro: acute motor dysfunction Psych: worsening mood Endocrine: polydipsia Heme: bleeding Allergy: hayfever  GEN: nad, alert and oriented HEENT: mucous membranes moist NECK: supple w/o LA CV: rrr. PULM:  ctab, no inc wob ABD: soft, +bs EXT: no edema SKIN: no acute rash

## 2024-01-25 LAB — TSH: TSH: 1.19 u[IU]/mL (ref 0.35–5.50)

## 2024-01-25 LAB — BRAIN NATRIURETIC PEPTIDE: Pro B Natriuretic peptide (BNP): 28 pg/mL (ref 1.0–100.0)

## 2024-01-31 ENCOUNTER — Ambulatory Visit: Payer: Self-pay | Admitting: Family Medicine
